# Patient Record
Sex: Male | Born: 2000 | Race: Black or African American | Hispanic: No | Marital: Single | Smoking: Current some day smoker
Health system: Southern US, Community
[De-identification: ages and names within clinical notes are randomized; demographics above are authoritative.]

## PROBLEM LIST (undated history)

## (undated) DIAGNOSIS — J302 Other seasonal allergic rhinitis: Secondary | ICD-10-CM

## (undated) HISTORY — PX: NO PAST SURGERIES: SHX2092

---
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
## 2018-12-02 ENCOUNTER — Encounter (HOSPITAL_COMMUNITY): Payer: Self-pay | Admitting: Emergency Medicine

## 2018-12-02 ENCOUNTER — Emergency Department (HOSPITAL_COMMUNITY)
Admission: EM | Admit: 2018-12-02 | Discharge: 2018-12-02 | Disposition: A | Discharge status: Home / Self Care | Payer: BLUE CROSS/BLUE SHIELD | Attending: Emergency Medicine | Admitting: Emergency Medicine

## 2018-12-02 ENCOUNTER — Other Ambulatory Visit: Payer: Self-pay

## 2018-12-02 DIAGNOSIS — H02846 Edema of left eye, unspecified eyelid: Secondary | ICD-10-CM | POA: Diagnosis present

## 2018-12-02 DIAGNOSIS — Z5321 Procedure and treatment not carried out due to patient leaving prior to being seen by health care provider: Secondary | ICD-10-CM | POA: Insufficient documentation

## 2018-12-02 NOTE — ED Notes (Signed)
Pt left without being seen by pa. Pt was roomed but left before provider could see pt about his illness

## 2018-12-02 NOTE — ED Triage Notes (Signed)
Pt here for left eye swelling for 1 week. Pt states it has not been draining

## 2018-12-04 ENCOUNTER — Other Ambulatory Visit: Payer: Self-pay

## 2018-12-04 ENCOUNTER — Ambulatory Visit (HOSPITAL_COMMUNITY)
Admission: EM | Admit: 2018-12-04 | Discharge: 2018-12-04 | Disposition: A | Discharge status: Home / Self Care | Payer: BLUE CROSS/BLUE SHIELD | Attending: Emergency Medicine | Admitting: Emergency Medicine

## 2018-12-04 ENCOUNTER — Encounter (HOSPITAL_COMMUNITY): Payer: Self-pay | Admitting: *Deleted

## 2018-12-04 DIAGNOSIS — H00024 Hordeolum internum left upper eyelid: Secondary | ICD-10-CM | POA: Diagnosis not present

## 2018-12-04 MED ORDER — SULFAMETHOXAZOLE-TRIMETHOPRIM 800-160 MG PO TABS: 1.0000 | ORAL_TABLET | Freq: Two times a day (BID) | ORAL | 0 refills | Status: AC

## 2018-12-04 NOTE — Discharge Instructions (Signed)
Complete course of antibiotics as prescribed.  Warm compresses 3-4 times a day.  Lid scrubs daily.  Please follow up with ophthalmology for recheck as may need further treatment if persistent.

## 2018-12-04 NOTE — ED Provider Notes (Signed)
[facility]    CSN: [id number] Arrival date & time: 12/04/18  1418     History   Chief Complaint Chief Complaint  Patient presents with  . Eye Problem    HPI Tom Carpenter is a 18 y.o. male.   Novak presents with complaints of left upper eye lid redness and swelling which has been waxing and waning for the past 3-4 weeks. States he has a history of styes which he treats at home, but this has been more persistent than usual. Improves with warm compresses. No eye ball pain or drainage. No vision changes or loss. Doesn't wear glasses or contacts. Hasn't followed with an eye doctor. No injury to the eye. No fevers. Without contributing medical history.      ROS per HPI.      History reviewed. No pertinent past medical history.  There are no active problems to display for this patient.   History reviewed. No pertinent surgical history.     Home Medications    Prior to Admission medications   Medication Sig Start Date End Date Taking? Authorizing Provider  sulfamethoxazole-trimethoprim (BACTRIM DS) 800-160 MG tablet Take 1 tablet by mouth 2 (two) times daily for 5 days. 12/04/18 12/09/18  Georgetta Haber, NP    Family History No family history on file.  Social History Social History   Tobacco Use  . Smoking status: Current Some Day Smoker  . Smokeless tobacco: Never Used  Substance Use Topics  . Alcohol use: Never    Frequency: Never  . Drug use: Never     Allergies   Patient has no allergy information on record.   Review of Systems Review of Systems   Physical Exam Triage Vital Signs ED Triage Vitals  Enc Vitals Group     BP 12/04/18 1501 (!) 109/50     Pulse Rate 12/04/18 1501 65     Resp 12/04/18 1501 12     Temp 12/04/18 1501 98.1 F (36.7 C)     Temp Source 12/04/18 1501 Oral     SpO2 12/04/18 1501 100 %     Weight --      Height 12/04/18 1508 6\' 1"  (1.854 m)     Head Circumference --      Peak Flow --      Pain Score  12/04/18 1508 6     Pain Loc --      Pain Edu? --      Excl. in GC? --    No data found.  Updated Vital Signs BP (!) 109/50 (BP Location: Left Arm)   Pulse 65   Temp 98.1 F (36.7 C) (Oral)   Resp 12   Ht 6\' 1"  (1.854 m)   SpO2 100%   BMI 20.45 kg/m    Physical Exam Constitutional:      Appearance: He is well-developed.  Eyes:     General: Vision grossly intact. Gaze aligned appropriately.        Left eye: Hordeolum present.No discharge.     Extraocular Movements: Extraocular movements intact.     Conjunctiva/sclera: Conjunctivae normal.  Cardiovascular:     Rate and Rhythm: Normal rate and regular rhythm.  Pulmonary:     Effort: Pulmonary effort is normal.     Breath sounds: Normal breath sounds.  Skin:    General: Skin is warm and dry.  Neurological:     Mental Status: He is alert and oriented to person, place, and time.  UC Treatments / Results  Labs (all labs ordered are listed, but only abnormal results are displayed) Labs Reviewed - No data to display  EKG None  Radiology No results found.  Procedures Procedures (including critical care time)  Medications Ordered in UC Medications - No data to display  Initial Impression / Assessment and Plan / UC Course  I have reviewed the triage vital signs and the nursing notes.  Pertinent labs & imaging results that were available during my care of the patient were reviewed by me and considered in my medical decision making (see chart for details).     Persistent left upper lid hordeolum. Will provide oral antibiotics as well as warm compresses and lid scrubs. Follow up with ophthalmology as may need further intervention. Patient verbalized understanding and agreeable to plan.   Final Clinical Impressions(s) / UC Diagnoses   Final diagnoses:  Hordeolum internum of left upper eyelid     Discharge Instructions     Complete course of antibiotics as prescribed.  Warm compresses 3-4 times a day.    Lid scrubs daily.  Please follow up with ophthalmology for recheck as may need further treatment if persistent.    ED Prescriptions    Medication Sig Dispense Auth. Provider   sulfamethoxazole-trimethoprim (BACTRIM DS) 800-160 MG tablet Take 1 tablet by mouth 2 (two) times daily for 5 days. 10 tablet Georgetta Haber, NP     Controlled Substance Prescriptions [place] Controlled Substance Registry consulted? Not Applicable   Georgetta Haber, NP 12/04/18 1626

## 2018-12-04 NOTE — ED Triage Notes (Signed)
C/o swelling to left eyelid onset 3 weeks ago, states the swelling comes and goes, he has been using warm compresses to  His eye

## 2020-08-31 ENCOUNTER — Encounter (HOSPITAL_COMMUNITY): Payer: Self-pay | Admitting: Obstetrics and Gynecology

## 2020-08-31 ENCOUNTER — Other Ambulatory Visit: Payer: Self-pay

## 2020-08-31 ENCOUNTER — Emergency Department (HOSPITAL_COMMUNITY): Payer: BC Managed Care – PPO

## 2020-08-31 ENCOUNTER — Emergency Department (HOSPITAL_COMMUNITY)
Admission: EM | Admit: 2020-08-31 | Discharge: 2020-08-31 | Disposition: A | Discharge status: Home / Self Care | Payer: BC Managed Care – PPO | Attending: Emergency Medicine | Admitting: Emergency Medicine

## 2020-08-31 DIAGNOSIS — R197 Diarrhea, unspecified: Secondary | ICD-10-CM | POA: Diagnosis not present

## 2020-08-31 DIAGNOSIS — R0602 Shortness of breath: Secondary | ICD-10-CM | POA: Diagnosis not present

## 2020-08-31 DIAGNOSIS — R112 Nausea with vomiting, unspecified: Secondary | ICD-10-CM | POA: Diagnosis present

## 2020-08-31 DIAGNOSIS — R079 Chest pain, unspecified: Secondary | ICD-10-CM | POA: Diagnosis not present

## 2020-08-31 DIAGNOSIS — F17290 Nicotine dependence, other tobacco product, uncomplicated: Secondary | ICD-10-CM | POA: Diagnosis not present

## 2020-08-31 DIAGNOSIS — R6883 Chills (without fever): Secondary | ICD-10-CM | POA: Diagnosis not present

## 2020-08-31 DIAGNOSIS — Z20822 Contact with and (suspected) exposure to covid-19: Secondary | ICD-10-CM | POA: Diagnosis not present

## 2020-08-31 LAB — CBC WITH DIFFERENTIAL/PLATELET
Abs Immature Granulocytes: 0.03 10*3/uL (ref 0.00–0.07)
Basophils Absolute: 0 10*3/uL (ref 0.0–0.1)
Basophils Relative: 0 %
Eosinophils Absolute: 0.1 10*3/uL (ref 0.0–0.5)
Eosinophils Relative: 1 %
HCT: 47.5 % (ref 39.0–52.0)
Hemoglobin: 16.8 g/dL (ref 13.0–17.0)
Immature Granulocytes: 0 %
Lymphocytes Relative: 17 %
Lymphs Abs: 1.6 10*3/uL (ref 0.7–4.0)
MCH: 31.3 pg (ref 26.0–34.0)
MCHC: 35.4 g/dL (ref 30.0–36.0)
MCV: 88.6 fL (ref 80.0–100.0)
Monocytes Absolute: 0.9 10*3/uL (ref 0.1–1.0)
Monocytes Relative: 9 %
Neutro Abs: 7 10*3/uL (ref 1.7–7.7)
Neutrophils Relative %: 73 %
Platelets: 294 10*3/uL (ref 150–400)
RBC: 5.36 MIL/uL (ref 4.22–5.81)
RDW: 11.2 % — ABNORMAL LOW (ref 11.5–15.5)
WBC: 9.7 10*3/uL (ref 4.0–10.5)
nRBC: 0 % (ref 0.0–0.2)

## 2020-08-31 LAB — COMPREHENSIVE METABOLIC PANEL
ALT: 15 U/L (ref 0–44)
AST: 19 U/L (ref 15–41)
Albumin: 5.2 g/dL — ABNORMAL HIGH (ref 3.5–5.0)
Alkaline Phosphatase: 65 U/L (ref 38–126)
Anion gap: 13 (ref 5–15)
BUN: 26 mg/dL — ABNORMAL HIGH (ref 6–20)
CO2: 29 mmol/L (ref 22–32)
Calcium: 9.8 mg/dL (ref 8.9–10.3)
Chloride: 89 mmol/L — ABNORMAL LOW (ref 98–111)
Creatinine, Ser: 1.08 mg/dL (ref 0.61–1.24)
GFR, Estimated: >60 mL/min (ref >60)
Glucose, Bld: 105 mg/dL — ABNORMAL HIGH (ref 70–99)
Potassium: 3.4 mmol/L — ABNORMAL LOW (ref 3.5–5.1)
Sodium: 131 mmol/L — ABNORMAL LOW (ref 135–145)
Total Bilirubin: 1 mg/dL (ref 0.3–1.2)
Total Protein: 8.7 g/dL — ABNORMAL HIGH (ref 6.5–8.1)

## 2020-08-31 LAB — RESP PANEL BY RT-PCR (FLU A&B, COVID) ARPGX2
Influenza A by PCR: NEGATIVE
Influenza B by PCR: NEGATIVE
SARS Coronavirus 2 by RT PCR: NEGATIVE

## 2020-08-31 LAB — LIPASE, BLOOD: Lipase: 24 U/L (ref 11–51)

## 2020-08-31 MED ORDER — ONDANSETRON HCL 8 MG PO TABS: 8.0000 mg | ORAL_TABLET | Freq: Three times a day (TID) | ORAL | 0 refills | Status: DC | PRN

## 2020-08-31 MED ORDER — SODIUM CHLORIDE 0.9 % IV BOLUS
1000.0000 mL | Freq: Once | INTRAVENOUS | Status: AC
  Administered 2020-08-31: 1000 mL via INTRAVENOUS

## 2020-08-31 NOTE — ED Triage Notes (Signed)
Patient reports he "had a stomach virus" on Sunday and has since become SOB and developed chest pain and feels like he cannot keep anything down

## 2020-08-31 NOTE — ED Provider Notes (Signed)
[facility]EM[facility]ENCY [facility]PT Provider Note   CSN: [id number] Arrival date & time: 08/31/20  3790     History Chief Complaint  Patient presents with  . Shortness of Breath  . Chest Pain    Tom Carpenter is a 19 y.o. male.  HPI He presents for evaluation of an illness for 4 days after eating some steak which he thought was "bad."  Initially had diarrhea, but none since.  He now has persistent vomiting, which is yellow in color.  He has not seen any blood in his output.  He has had chills but not documented a fever.  He denies cough, shortness of breath, focal weakness or paresthesia.  No known sick contacts.  There are no other known modifying factors.    No past medical history on file.  There are no problems to display for this patient.   No past surgical history on file.     No family history on file.  Social History   Tobacco Use  . Smoking status: Current Some Day Smoker    Types: Cigars  . Smokeless tobacco: Never Used  Vaping Use  . Vaping Use: Never used  Substance Use Topics  . Alcohol use: Never  . Drug use: Yes    Types: Marijuana    Home Medications Prior to Admission medications   Medication Sig Start Date End Date Taking? Authorizing Provider  ondansetron (ZOFRAN) 8 MG tablet Take 1 tablet (8 mg total) by mouth every 8 (eight) hours as needed for nausea or vomiting. 08/31/20   Mancel Bale, MD    Allergies    Patient has no known allergies.  Review of Systems   Review of Systems  All other systems reviewed and are negative.   Physical Exam Updated Vital Signs BP 121/73 (BP Location: Left Arm)   Pulse (!) 55   Temp 99.7 F (37.6 C) (Oral)   Resp 14   Ht 6\' 1"  (1.854 m)   Wt 57.6 kg   SpO2 99%   BMI 16.76 kg/m   Physical Exam Vitals and nursing note reviewed.  Constitutional:      General: He is not in acute distress.    Appearance: He is well-developed. He is not ill-appearing, toxic-appearing or  diaphoretic.  HENT:     Head: Normocephalic and atraumatic.     Right Ear: External ear normal.     Left Ear: External ear normal.  Eyes:     Conjunctiva/sclera: Conjunctivae normal.     Pupils: Pupils are equal, round, and reactive to light.  Neck:     Trachea: Phonation normal.  Cardiovascular:     Rate and Rhythm: Normal rate.  Pulmonary:     Effort: Pulmonary effort is normal. No respiratory distress.     Breath sounds: No stridor.  Abdominal:     General: There is no distension.     Palpations: Abdomen is soft.     Tenderness: There is no abdominal tenderness.  Musculoskeletal:        General: Normal range of motion.     Cervical back: Normal range of motion and neck supple.  Skin:    General: Skin is warm and dry.  Neurological:     Mental Status: He is alert and oriented to person, place, and time.     Cranial Nerves: No cranial nerve deficit.     Sensory: No sensory deficit.     Motor: No abnormal muscle tone.     Coordination: Coordination  normal.  Psychiatric:        Mood and Affect: Mood normal.        Behavior: Behavior normal.        Thought Content: Thought content normal.        Judgment: Judgment normal.     ED Results / Procedures / Treatments   Labs (all labs ordered are listed, but only abnormal results are displayed) Labs Reviewed  COMPREHENSIVE METABOLIC PANEL - Abnormal; Notable for the following components:      Result Value   Sodium 131 (*)    Potassium 3.4 (*)    Chloride 89 (*)    Glucose, Bld 105 (*)    BUN 26 (*)    Total Protein 8.7 (*)    Albumin 5.2 (*)    All other components within normal limits  CBC WITH DIFFERENTIAL/PLATELET - Abnormal; Notable for the following components:   RDW 11.2 (*)    All other components within normal limits  RESP PANEL BY RT-PCR (FLU A&B, COVID) ARPGX2  LIPASE, BLOOD    EKG EKG Interpretation  Date/Time:  Thursday August 31 2020 08:48:05 EST Ventricular Rate:  51 PR Interval:    QRS  Duration: 100 QT Interval:  444 QTC Calculation: 409 R Axis:   87 Text Interpretation: Sinus rhythm RSR' in V1 or V2, probably normal variant Probable left ventricular hypertrophy Abnrm T, consider ischemia, anterolateral lds ST elev, probable normal early repol pattern No old tracing to compare Confirmed by Mancel Bale [phone]) on 08/31/2020 8:51:52 AM   Radiology DG Chest 2 View  Result Date: 08/31/2020 CLINICAL DATA:  Shortness of breath, nausea and vomiting EXAM: CHEST - 2 VIEW COMPARISON:  None. FINDINGS: The cardiomediastinal silhouette is normal in contour. No pleural effusion. No pneumothorax. No acute pleuroparenchymal abnormality. Visualized abdomen is unremarkable. No acute osseous abnormality noted. IMPRESSION: No acute cardiopulmonary abnormality. Electronically Signed   By: Meda Klinefelter MD   On: 08/31/2020 09:15    Procedures Procedures (including critical care time)  Medications Ordered in ED Medications  sodium chloride 0.9 % bolus 1,000 mL (0 mLs Intravenous Stopped 08/31/20 1057)    ED Course  I have reviewed the triage vital signs and the nursing notes.  Pertinent labs & imaging results that were available during my care of the patient were reviewed by me and considered in my medical decision making (see chart for details).    MDM Rules/Calculators/A&P                           Patient Vitals for the past 24 hrs:  BP Temp Temp src Pulse Resp SpO2 Height Weight  08/31/20 1205 121/73 -- -- (!) 55 14 99 % -- --  08/31/20 1000 120/80 -- -- (!) 53 20 100 % -- --  08/31/20 0851 127/84 99.7 F (37.6 C) Oral (!) 52 17 100 % 6\' 1"  (1.854 m) 57.6 kg    12:27 PM Reevaluation with update and discussion. After initial assessment and treatment, an updated evaluation reveals eating and drinking normally at this time.  Findings discussed with the patient and all questions were answered.   Medical Decision Making:  This patient is presenting for  evaluation of nausea, vomiting and diarrhea, which does require a range of treatment options, and is a complaint that involves a moderate risk of morbidity and mortality. The differential diagnoses include gastritis, food poisoning, Covid infection. I decided to review old records, and  in summary healthy young male with 4-day illness primary gastrointestinal symptoms.  I did not require additional historical information from anyone.  Clinical Laboratory Tests Ordered, included CBC, Metabolic panel and Covid test, lipase. Review indicates negative viral swab, normal CBC, normal lipase, hyponatremia, hypokalemia, hypochloremia, hyperglycemia, azotemia. Radiologic Tests Ordered, included chest x-ray.  I independently Visualized: Radiographic images, which show no infiltrate or edema     Critical Interventions-clinical evaluation, laboratory testing, IV fluids, observation and reassessment  After These Interventions, the Patient was reevaluated and was found improved, tolerating oral intake.  Doubt serious bacterial infection, metabolic instability or impending vascular collapse.  Suspect nonspecific enteritis.  HOBART MARTE was evaluated in Emergency Department on 08/31/2020 for the symptoms described in the history of present illness. He was evaluated in the context of the global COVID-19 pandemic, which necessitated consideration that the patient might be at risk for infection with the SARS-CoV-2 virus that causes COVID-19. Institutional protocols and algorithms that pertain to the evaluation of patients at risk for COVID-19 are in a state of rapid change based on information released by regulatory bodies including the CDC and federal and state organizations. These policies and algorithms were followed during the patient's care in the ED.   CRITICAL CARE-no Performed by: Mancel Bale  Nursing Notes Reviewed/ Care Coordinated Applicable Imaging Reviewed Interpretation of Laboratory Data  incorporated into ED treatment  The patient appears reasonably screened and/or stabilized for discharge and I doubt any other medical condition or other [facility] requiring further screening, evaluation, or treatment in the ED at this time prior to discharge.  Plan: Home Medications-OTC as needed; Home Treatments-gradual advance diet; return here if the recommended treatment, does not improve the symptoms; Recommended follow up-PCP, as needed     Final Clinical Impression(s) / ED Diagnoses Final diagnoses:  Nausea vomiting and diarrhea    Rx / DC Orders ED Discharge Orders         Ordered    ondansetron (ZOFRAN) 8 MG tablet  Every 8 hours PRN        08/31/20 1227           Mancel Bale, MD 08/31/20 1231

## 2020-08-31 NOTE — Discharge Instructions (Signed)
Start with clear liquids and crackers today then gradually advance her diet.  You will likely continue to be sick another few days.  Follow-up with the doctor of your choice, as needed for problems.

## 2020-08-31 NOTE — ED Notes (Signed)
Patient stated he would like to try to eat something. After checking with the nurse patient was given two packs of crackers and more ice water.

## 2021-12-27 IMAGING — CR DG CHEST 2V
2 series · 2 of 2 positions shown · non-contrast
Comparison: None.

CLINICAL DATA: Shortness of breath, nausea and vomiting

EXAM:
CHEST - 2 VIEW

[w chest pa]
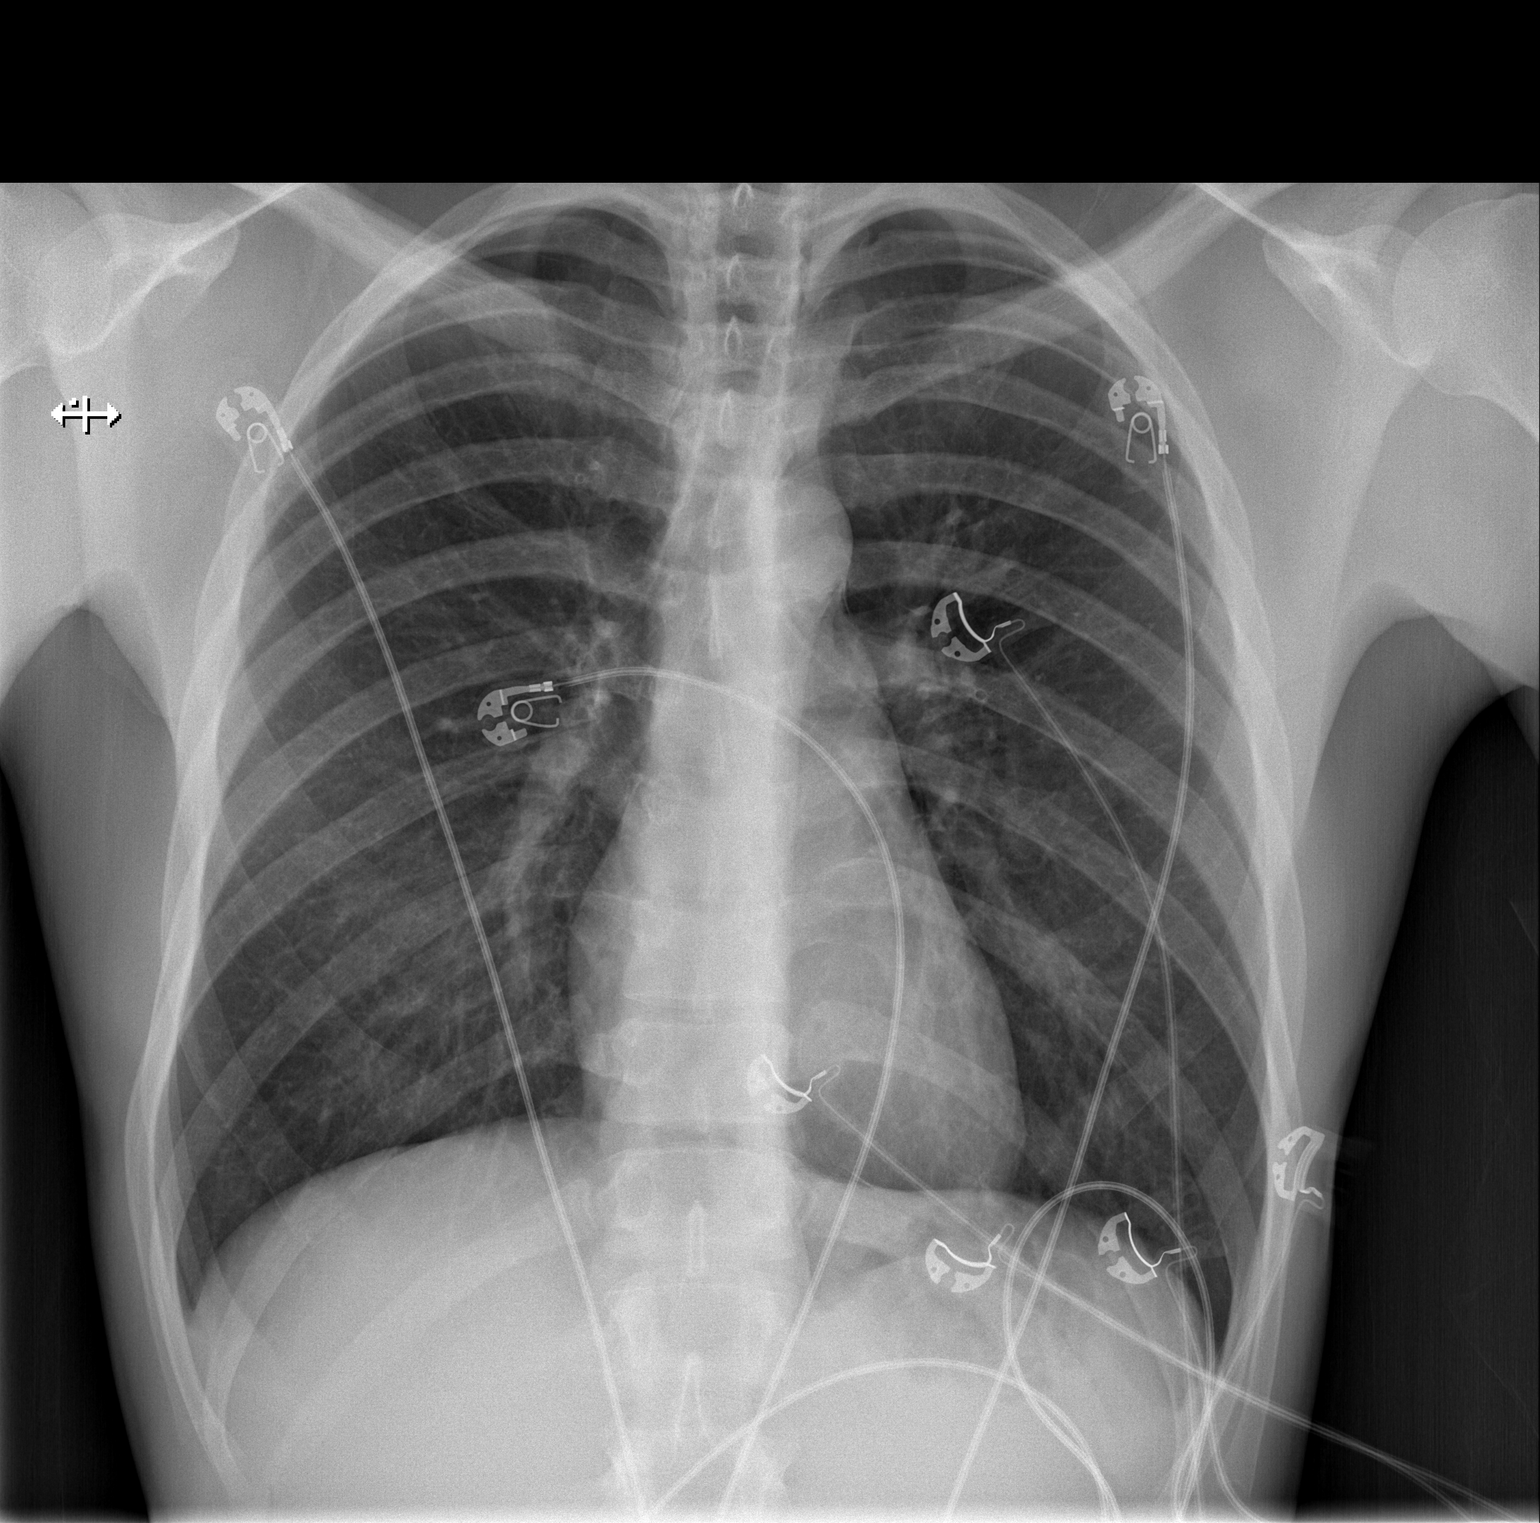

[w chest lat]
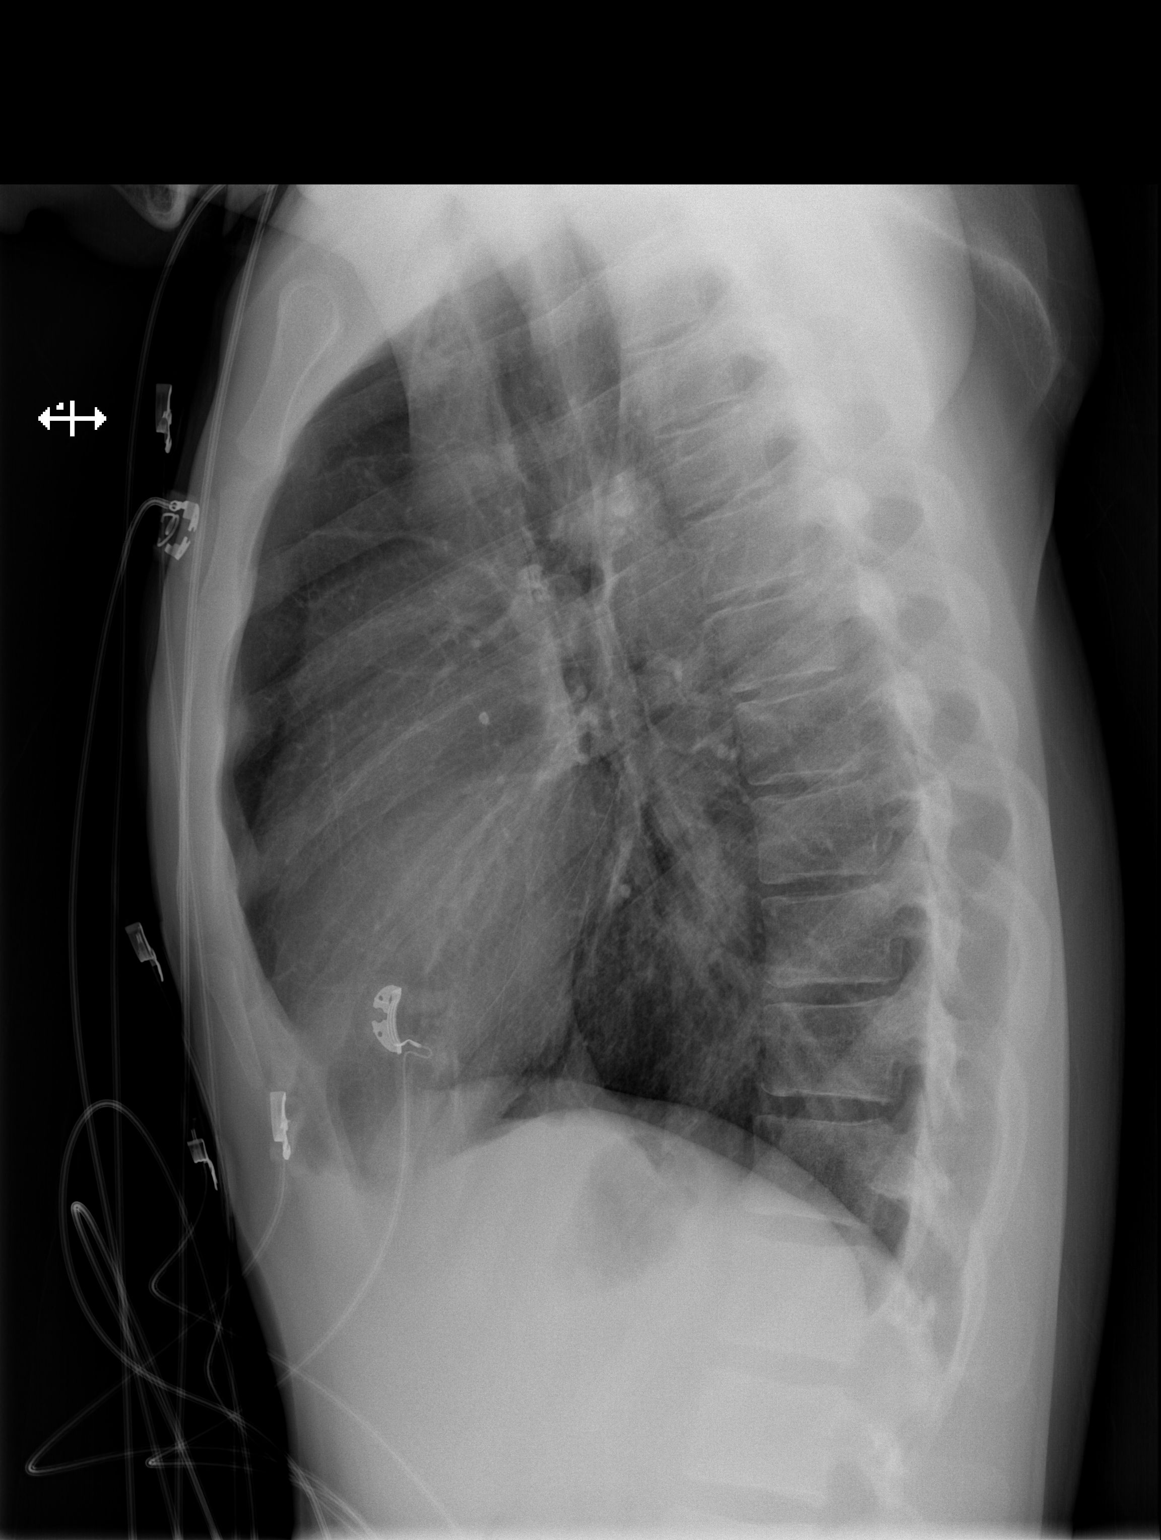

[2 of 2 positions shown; findings below may reference images not displayed]

FINDINGS: The cardiomediastinal silhouette is normal in contour. No pleural
effusion. No pneumothorax. No acute pleuroparenchymal abnormality.
Visualized abdomen is unremarkable. No acute osseous abnormality
noted.
IMPRESSION: No acute cardiopulmonary abnormality.

## 2022-03-26 ENCOUNTER — Emergency Department (HOSPITAL_COMMUNITY): Payer: BC Managed Care – PPO

## 2022-03-26 ENCOUNTER — Other Ambulatory Visit: Payer: Self-pay

## 2022-03-26 ENCOUNTER — Emergency Department (HOSPITAL_COMMUNITY)
Admission: EM | Admit: 2022-03-26 | Discharge: 2022-03-26 | Disposition: A | Discharge status: Home / Self Care | Payer: BC Managed Care – PPO | Attending: Emergency Medicine | Admitting: Emergency Medicine

## 2022-03-26 DIAGNOSIS — W3400XA Accidental discharge from unspecified firearms or gun, initial encounter: Secondary | ICD-10-CM | POA: Diagnosis not present

## 2022-03-26 DIAGNOSIS — E876 Hypokalemia: Secondary | ICD-10-CM | POA: Diagnosis not present

## 2022-03-26 DIAGNOSIS — S71132A Puncture wound without foreign body, left thigh, initial encounter: Secondary | ICD-10-CM

## 2022-03-26 DIAGNOSIS — Y903 Blood alcohol level of 60-79 mg/100 ml: Secondary | ICD-10-CM | POA: Diagnosis not present

## 2022-03-26 DIAGNOSIS — S71142A Puncture wound with foreign body, left thigh, initial encounter: Secondary | ICD-10-CM | POA: Insufficient documentation

## 2022-03-26 DIAGNOSIS — S92101B Unspecified fracture of right talus, initial encounter for open fracture: Secondary | ICD-10-CM

## 2022-03-26 DIAGNOSIS — Z20822 Contact with and (suspected) exposure to covid-19: Secondary | ICD-10-CM | POA: Diagnosis not present

## 2022-03-26 DIAGNOSIS — S92191B Other fracture of right talus, initial encounter for open fracture: Secondary | ICD-10-CM | POA: Insufficient documentation

## 2022-03-26 DIAGNOSIS — E872 Acidosis, unspecified: Secondary | ICD-10-CM | POA: Insufficient documentation

## 2022-03-26 DIAGNOSIS — R780 Finding of alcohol in blood: Secondary | ICD-10-CM | POA: Insufficient documentation

## 2022-03-26 DIAGNOSIS — S99911A Unspecified injury of right ankle, initial encounter: Secondary | ICD-10-CM | POA: Diagnosis present

## 2022-03-26 LAB — URINALYSIS, ROUTINE W REFLEX MICROSCOPIC
Bilirubin Urine: NEGATIVE
Glucose, UA: NEGATIVE mg/dL
Hgb urine dipstick: NEGATIVE
Ketones, ur: NEGATIVE mg/dL
Leukocytes,Ua: NEGATIVE
Nitrite: NEGATIVE
Protein, ur: NEGATIVE mg/dL
Specific Gravity, Urine: 1.024 (ref 1.005–1.030)
pH: 6 (ref 5.0–8.0)

## 2022-03-26 LAB — COMPREHENSIVE METABOLIC PANEL
ALT: 12 U/L (ref 0–44)
AST: 20 U/L (ref 15–41)
Albumin: 4.4 g/dL (ref 3.5–5.0)
Alkaline Phosphatase: 105 U/L (ref 38–126)
Anion gap: 13 (ref 5–15)
BUN: 10 mg/dL (ref 6–20)
CO2: 21 mmol/L — ABNORMAL LOW (ref 22–32)
Calcium: 9.5 mg/dL (ref 8.9–10.3)
Chloride: 103 mmol/L (ref 98–111)
Creatinine, Ser: 1.04 mg/dL (ref 0.61–1.24)
GFR, Estimated: >60 mL/min (ref >60)
Glucose, Bld: 112 mg/dL — ABNORMAL HIGH (ref 70–99)
Potassium: 3 mmol/L — ABNORMAL LOW (ref 3.5–5.1)
Sodium: 137 mmol/L (ref 135–145)
Total Bilirubin: 0.3 mg/dL (ref 0.3–1.2)
Total Protein: 7.3 g/dL (ref 6.5–8.1)

## 2022-03-26 LAB — LACTIC ACID, PLASMA: Lactic Acid, Venous: 3 mmol/L (ref 0.5–1.9)

## 2022-03-26 LAB — CBC
HCT: 42.6 % (ref 39.0–52.0)
Hemoglobin: 14.3 g/dL (ref 13.0–17.0)
MCH: 31 pg (ref 26.0–34.0)
MCHC: 33.6 g/dL (ref 30.0–36.0)
MCV: 92.4 fL (ref 80.0–100.0)
Platelets: 314 10*3/uL (ref 150–400)
RBC: 4.61 MIL/uL (ref 4.22–5.81)
RDW: 12.9 % (ref 11.5–15.5)
WBC: 12 10*3/uL — ABNORMAL HIGH (ref 4.0–10.5)
nRBC: 0 % (ref 0.0–0.2)

## 2022-03-26 LAB — I-STAT CHEM 8, ED
BUN: 10 mg/dL (ref 6–20)
Calcium, Ion: 1.07 mmol/L — ABNORMAL LOW (ref 1.15–1.40)
Chloride: 102 mmol/L (ref 98–111)
Creatinine, Ser: 1.1 mg/dL (ref 0.61–1.24)
Glucose, Bld: 111 mg/dL — ABNORMAL HIGH (ref 70–99)
HCT: 45 % (ref 39.0–52.0)
Hemoglobin: 15.3 g/dL (ref 13.0–17.0)
Potassium: 2.9 mmol/L — ABNORMAL LOW (ref 3.5–5.1)
Sodium: 138 mmol/L (ref 135–145)
TCO2: 24 mmol/L (ref 22–32)

## 2022-03-26 LAB — RESP PANEL BY RT-PCR (FLU A&B, COVID) ARPGX2
Influenza A by PCR: NEGATIVE
Influenza B by PCR: NEGATIVE
SARS Coronavirus 2 by RT PCR: NEGATIVE

## 2022-03-26 LAB — PROTIME-INR
INR: 1.1 (ref 0.8–1.2)
Prothrombin Time: 13.6 seconds (ref 11.4–15.2)

## 2022-03-26 LAB — SAMPLE TO BLOOD BANK

## 2022-03-26 LAB — ETHANOL: Alcohol, Ethyl (B): 65 mg/dL — ABNORMAL HIGH (ref <10)

## 2022-03-26 MED ORDER — TETANUS-DIPHTH-ACELL PERTUSSIS 5-2.5-18.5 LF-MCG/0.5 IM SUSY: 0.5000 mL | PREFILLED_SYRINGE | Freq: Once | INTRAMUSCULAR | Status: DC

## 2022-03-26 MED ORDER — SENNOSIDES-DOCUSATE SODIUM 8.6-50 MG PO TABS: 1.0000 | ORAL_TABLET | Freq: Every evening | ORAL | 0 refills | Status: DC | PRN

## 2022-03-26 MED ORDER — FENTANYL CITRATE PF 50 MCG/ML IJ SOSY
100.0000 ug | PREFILLED_SYRINGE | Freq: Once | INTRAMUSCULAR | Status: AC
  Administered 2022-03-26: 100 ug via INTRAVENOUS
  Filled 2022-03-26: qty 2

## 2022-03-26 MED ORDER — IBUPROFEN 600 MG PO TABS: 600.0000 mg | ORAL_TABLET | Freq: Four times a day (QID) | ORAL | 0 refills | Status: DC | PRN

## 2022-03-26 MED ORDER — IOHEXOL 350 MG/ML SOLN
100.0000 mL | Freq: Once | INTRAVENOUS | Status: AC | PRN
  Administered 2022-03-26: 100 mL via INTRAVENOUS

## 2022-03-26 MED ORDER — SODIUM CHLORIDE 0.9 % IV BOLUS
1000.0000 mL | Freq: Once | INTRAVENOUS | Status: AC
  Administered 2022-03-26: 1000 mL via INTRAVENOUS

## 2022-03-26 MED ORDER — CEFAZOLIN SODIUM-DEXTROSE 2-4 GM/100ML-% IV SOLN
2.0000 g | Freq: Once | INTRAVENOUS | Status: AC
  Administered 2022-03-26: 2 g via INTRAVENOUS
  Filled 2022-03-26: qty 100

## 2022-03-26 MED ORDER — CEPHALEXIN 500 MG PO CAPS
500.0000 mg | ORAL_CAPSULE | Freq: Two times a day (BID) | ORAL | 0 refills | Status: AC
Start: 2022-03-26 — End: 2022-04-02

## 2022-03-26 MED ORDER — OXYCODONE-ACETAMINOPHEN 5-325 MG PO TABS: 1.0000 | ORAL_TABLET | Freq: Four times a day (QID) | ORAL | 0 refills | Status: DC | PRN

## 2022-03-26 MED ORDER — POTASSIUM CHLORIDE CRYS ER 20 MEQ PO TBCR: 40.0000 meq | EXTENDED_RELEASE_TABLET | Freq: Every day | ORAL | 0 refills | Status: DC
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
## 2022-03-26 NOTE — ED Notes (Signed)
Ortho called 

## 2022-03-26 NOTE — ED Notes (Signed)
Patient verbalizes understanding of discharge instructions. Opportunity for questioning and answers were provided. Armband removed by staff, pt discharged from ED via wheelchair.  

## 2022-03-26 NOTE — ED Notes (Signed)
Pt taken to CT.
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
## 2022-03-26 NOTE — Progress Notes (Signed)
Orthopedic Tech Progress Note Patient Details:  JAIDIN RICHISON [birth year] [id number]  Ortho Devices Type of Ortho Device: Cotton web roll, Crutches, Stirrup splint, Short leg splint Ortho Device/Splint Location: RLE Ortho Device/Splint Interventions: Ordered, Application, Adjustment   Post Interventions Patient Tolerated: Well Instructions Provided: Care of device  Donald Pore 03/26/2022, 5:56 AM

## 2022-03-26 NOTE — ED Notes (Signed)
MD notified of critical lactic  
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
## 2022-03-26 NOTE — ED Notes (Signed)
Pt taken to CT with this RN.

## 2022-03-26 NOTE — ED Notes (Signed)
Ortho at bedside.
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
## 2022-04-01 ENCOUNTER — Other Ambulatory Visit: Payer: Self-pay | Admitting: Orthopaedic Surgery

## 2022-04-01 ENCOUNTER — Encounter (HOSPITAL_COMMUNITY): Payer: Self-pay | Admitting: Orthopaedic Surgery

## 2022-04-01 NOTE — Progress Notes (Signed)
PCP - none Cardiologist - n/a  Chest x-ray - 03/26/22 (1V) EKG - n/a Stress Test - n/a ECHO - n/a Cardiac Cath - n/a  ICD Pacemaker/Loop - n/a  Sleep Study -  n/a CPAP - none  STOP now taking any Aspirin (unless otherwise instructed by your surgeon), Aleve, Naproxen, Ibuprofen, Motrin, Advil, Goody's, BC's, all herbal medications, fish oil, and all vitamins.   Coronavirus Screening Do you have any of the following symptoms:  Cough yes/no: No Fever (>100.30F)  yes/no: No Runny nose yes/no: No Sore throat yes/no: No Difficulty breathing/shortness of breath  yes/no: No  Have you traveled in the last 14 days and where? yes/no: No  Patient verbalized understanding of instructions that were given via phone.

## 2022-04-02 ENCOUNTER — Other Ambulatory Visit: Payer: Self-pay

## 2022-04-02 ENCOUNTER — Ambulatory Visit (HOSPITAL_COMMUNITY): Payer: BC Managed Care – PPO | Admitting: Anesthesiology

## 2022-04-02 ENCOUNTER — Encounter (HOSPITAL_COMMUNITY): Payer: Self-pay | Admitting: Orthopaedic Surgery

## 2022-04-02 ENCOUNTER — Ambulatory Visit (HOSPITAL_COMMUNITY)
Admission: RE | Admit: 2022-04-02 | Discharge: 2022-04-02 | Disposition: A | Discharge status: Home / Self Care | Payer: BC Managed Care – PPO | Source: Ambulatory Visit | Attending: Orthopaedic Surgery | Admitting: Orthopaedic Surgery

## 2022-04-02 ENCOUNTER — Encounter (HOSPITAL_COMMUNITY)
Admission: RE | Disposition: A | Discharge status: Home / Self Care | Payer: Self-pay | Source: Ambulatory Visit | Attending: Orthopaedic Surgery

## 2022-04-02 ENCOUNTER — Ambulatory Visit (HOSPITAL_COMMUNITY): Payer: BC Managed Care – PPO

## 2022-04-02 DIAGNOSIS — F17200 Nicotine dependence, unspecified, uncomplicated: Secondary | ICD-10-CM | POA: Diagnosis not present

## 2022-04-02 DIAGNOSIS — W3400XA Accidental discharge from unspecified firearms or gun, initial encounter: Secondary | ICD-10-CM | POA: Diagnosis not present

## 2022-04-02 DIAGNOSIS — S71142A Puncture wound with foreign body, left thigh, initial encounter: Secondary | ICD-10-CM | POA: Insufficient documentation

## 2022-04-02 DIAGNOSIS — S92121B Displaced fracture of body of right talus, initial encounter for open fracture: Secondary | ICD-10-CM | POA: Diagnosis present

## 2022-04-02 DIAGNOSIS — F1290 Cannabis use, unspecified, uncomplicated: Secondary | ICD-10-CM | POA: Insufficient documentation

## 2022-04-02 DIAGNOSIS — F17290 Nicotine dependence, other tobacco product, uncomplicated: Secondary | ICD-10-CM | POA: Diagnosis not present

## 2022-04-02 HISTORY — DX: Other seasonal allergic rhinitis: J30.2

## 2022-04-02 HISTORY — PX: I & D EXTREMITY: SHX5045

## 2022-04-02 HISTORY — PX: FOREIGN BODY REMOVAL: SHX962

## 2022-04-02 HISTORY — PX: OPEN REDUCTION INTERNAL FIXATION (ORIF) FOOT LISFRANC FRACTURE: SHX5990

## 2022-04-02 LAB — POCT I-STAT, CHEM 8
BUN: 14 mg/dL (ref 6–20)
Calcium, Ion: 1.16 mmol/L (ref 1.15–1.40)
Chloride: 102 mmol/L (ref 98–111)
Creatinine, Ser: 0.8 mg/dL (ref 0.61–1.24)
Glucose, Bld: 104 mg/dL — ABNORMAL HIGH (ref 70–99)
HCT: 42 % (ref 39.0–52.0)
Hemoglobin: 14.3 g/dL (ref 13.0–17.0)
Potassium: 3.8 mmol/L (ref 3.5–5.1)
Sodium: 137 mmol/L (ref 135–145)
TCO2: 24 mmol/L (ref 22–32)

## 2022-04-02 SURGERY — OPEN REDUCTION INTERNAL FIXATION (ORIF) FOOT LISFRANC FRACTURE
Anesthesia: General | Site: Foot | Laterality: Right

## 2022-04-02 MED ORDER — CHLORHEXIDINE GLUCONATE 0.12 % MT SOLN
15.0000 mL | Freq: Once | OROMUCOSAL | Status: AC
  Administered 2022-04-02: 15 mL via OROMUCOSAL
  Filled 2022-04-02: qty 15

## 2022-04-02 MED ORDER — LACTATED RINGERS IV SOLN: INTRAVENOUS | Status: DC

## 2022-04-02 MED ORDER — EPHEDRINE SULFATE-NACL 50-0.9 MG/10ML-% IV SOSY
PREFILLED_SYRINGE | INTRAVENOUS | Status: DC | PRN
  Administered 2022-04-02 (×2): 5 mg via INTRAVENOUS

## 2022-04-02 MED ORDER — PROPOFOL 10 MG/ML IV BOLUS
INTRAVENOUS | Status: AC
  Filled 2022-04-02: qty 20

## 2022-04-02 MED ORDER — 0.9 % SODIUM CHLORIDE (POUR BTL) OPTIME
TOPICAL | Status: DC | PRN
  Administered 2022-04-02: 1000 mL

## 2022-04-02 MED ORDER — CELECOXIB 200 MG PO CAPS
200.0000 mg | ORAL_CAPSULE | Freq: Once | ORAL | Status: AC
  Administered 2022-04-02: 200 mg via ORAL
  Filled 2022-04-02: qty 1

## 2022-04-02 MED ORDER — PROPOFOL 10 MG/ML IV BOLUS
INTRAVENOUS | Status: DC | PRN
  Administered 2022-04-02: 50 mg via INTRAVENOUS
  Administered 2022-04-02: 200 mg via INTRAVENOUS

## 2022-04-02 MED ORDER — ACETAMINOPHEN 160 MG/5ML PO SOLN: 325.0000 mg | ORAL | Status: DC | PRN

## 2022-04-02 MED ORDER — DEXAMETHASONE SODIUM PHOSPHATE 4 MG/ML IJ SOLN
INTRAMUSCULAR | Status: DC | PRN
  Administered 2022-04-02: 10 mg via INTRAVENOUS

## 2022-04-02 MED ORDER — LIDOCAINE 2% (20 MG/ML) 5 ML SYRINGE
INTRAMUSCULAR | Status: AC
  Filled 2022-04-02: qty 5

## 2022-04-02 MED ORDER — LIDOCAINE HCL (CARDIAC) PF 100 MG/5ML IV SOSY
PREFILLED_SYRINGE | INTRAVENOUS | Status: DC | PRN
  Administered 2022-04-02: 60 mg via INTRAVENOUS

## 2022-04-02 MED ORDER — DEXMEDETOMIDINE HCL IN NACL 200 MCG/50ML IV SOLN
INTRAVENOUS | Status: DC | PRN
  Administered 2022-04-02: 12 ug via INTRAVENOUS

## 2022-04-02 MED ORDER — MIDAZOLAM HCL 2 MG/2ML IJ SOLN
INTRAMUSCULAR | Status: DC
Start: 2022-04-02 — End: 2022-04-02
  Filled 2022-04-02: qty 2

## 2022-04-02 MED ORDER — ONDANSETRON HCL 4 MG/2ML IJ SOLN
INTRAMUSCULAR | Status: DC | PRN
  Administered 2022-04-02: 4 mg via INTRAVENOUS

## 2022-04-02 MED ORDER — DEXMEDETOMIDINE HCL IN NACL 80 MCG/20ML IV SOLN
INTRAVENOUS | Status: AC
  Filled 2022-04-02: qty 20

## 2022-04-02 MED ORDER — MIDAZOLAM HCL 5 MG/5ML IJ SOLN
INTRAMUSCULAR | Status: DC | PRN
  Administered 2022-04-02: 2 mg via INTRAVENOUS

## 2022-04-02 MED ORDER — FENTANYL CITRATE (PF) 100 MCG/2ML IJ SOLN: 25.0000 ug | INTRAMUSCULAR | Status: DC | PRN

## 2022-04-02 MED ORDER — CEFAZOLIN SODIUM-DEXTROSE 2-4 GM/100ML-% IV SOLN
2.0000 g | INTRAVENOUS | Status: AC
  Administered 2022-04-02: 2 g via INTRAVENOUS
  Filled 2022-04-02: qty 100

## 2022-04-02 MED ORDER — ONDANSETRON HCL 4 MG/2ML IJ SOLN: 4.0000 mg | Freq: Once | INTRAMUSCULAR | Status: DC | PRN

## 2022-04-02 MED ORDER — GLYCOPYRROLATE PF 0.2 MG/ML IJ SOSY
PREFILLED_SYRINGE | INTRAMUSCULAR | Status: DC | PRN
  Administered 2022-04-02: .2 mg via INTRAVENOUS

## 2022-04-02 MED ORDER — FENTANYL CITRATE (PF) 100 MCG/2ML IJ SOLN: INTRAMUSCULAR | Status: DC | PRN

## 2022-04-02 MED ORDER — ORAL CARE MOUTH RINSE: 15.0000 mL | Freq: Once | OROMUCOSAL | Status: AC

## 2022-04-02 MED ORDER — EPHEDRINE 5 MG/ML INJ
INTRAVENOUS | Status: AC
  Filled 2022-04-02: qty 5

## 2022-04-02 MED ORDER — ONDANSETRON HCL 4 MG/2ML IJ SOLN
INTRAMUSCULAR | Status: AC
  Filled 2022-04-02: qty 2

## 2022-04-02 MED ORDER — MIDAZOLAM HCL 2 MG/2ML IJ SOLN
INTRAMUSCULAR | Status: AC
Start: 2022-04-02 — End: ?
  Filled 2022-04-02: qty 2

## 2022-04-02 MED ORDER — GLYCOPYRROLATE PF 0.2 MG/ML IJ SOSY
PREFILLED_SYRINGE | INTRAMUSCULAR | Status: AC
  Filled 2022-04-02: qty 1

## 2022-04-02 MED ORDER — DEXAMETHASONE SODIUM PHOSPHATE 10 MG/ML IJ SOLN
INTRAMUSCULAR | Status: AC
  Filled 2022-04-02: qty 1

## 2022-04-02 MED ORDER — OXYCODONE HCL 5 MG/5ML PO SOLN: 5.0000 mg | Freq: Once | ORAL | Status: AC | PRN

## 2022-04-02 MED ORDER — FENTANYL CITRATE (PF) 100 MCG/2ML IJ SOLN
INTRAMUSCULAR | Status: AC
  Filled 2022-04-02: qty 2

## 2022-04-02 MED ORDER — ROCURONIUM BROMIDE 10 MG/ML (PF) SYRINGE
PREFILLED_SYRINGE | INTRAVENOUS | Status: AC
Start: 2022-04-02 — End: ?
  Filled 2022-04-02: qty 10

## 2022-04-02 MED ORDER — FENTANYL CITRATE (PF) 250 MCG/5ML IJ SOLN
INTRAMUSCULAR | Status: DC | PRN
  Administered 2022-04-02 (×3): 50 ug via INTRAVENOUS

## 2022-04-02 MED ORDER — OXYCODONE HCL 5 MG PO TABS
5.0000 mg | ORAL_TABLET | Freq: Once | ORAL | Status: AC | PRN
  Administered 2022-04-02: 5 mg via ORAL

## 2022-04-02 MED ORDER — ACETAMINOPHEN 325 MG PO TABS: 325.0000 mg | ORAL_TABLET | ORAL | Status: DC | PRN

## 2022-04-02 MED ORDER — BUPIVACAINE HCL 0.5 % IJ SOLN
INTRAMUSCULAR | Status: AC
  Filled 2022-04-02: qty 1

## 2022-04-02 MED ORDER — OXYCODONE HCL 5 MG PO TABS
5.0000 mg | ORAL_TABLET | ORAL | 0 refills | Status: AC | PRN
Start: 2022-04-02 — End: 2022-04-07

## 2022-04-02 MED ORDER — MEPERIDINE HCL 25 MG/ML IJ SOLN: 6.2500 mg | INTRAMUSCULAR | Status: DC | PRN

## 2022-04-02 MED ORDER — FENTANYL CITRATE (PF) 250 MCG/5ML IJ SOLN
INTRAMUSCULAR | Status: AC
  Filled 2022-04-02: qty 5

## 2022-04-02 MED ORDER — ACETAMINOPHEN 500 MG PO TABS
1000.0000 mg | ORAL_TABLET | Freq: Once | ORAL | Status: AC
  Administered 2022-04-02: 1000 mg via ORAL
  Filled 2022-04-02: qty 2

## 2022-04-02 MED ORDER — OXYCODONE HCL 5 MG PO TABS
ORAL_TABLET | ORAL | Status: AC
  Filled 2022-04-02: qty 1

## 2022-04-02 SURGICAL SUPPLY — 70 items
ALCOHOL 70% 16 OZ (MISCELLANEOUS) ×4 IMPLANT
APL PRP STRL LF DISP 70% ISPRP (MISCELLANEOUS) ×6
BAG COUNTER SPONGE SURGICOUNT (BAG) ×4 IMPLANT
BAG SPNG CNTER NS LX DISP (BAG) ×3
BANDAGE ESMARK 6X9 LF (GAUZE/BANDAGES/DRESSINGS) IMPLANT
BIT DRILL 3 CANN STRGHT (BIT) ×4
BLADE SURG 15 STRL LF DISP TIS (BLADE) ×3 IMPLANT
BLADE SURG 15 STRL SS (BLADE) ×4
BNDG CMPR 9X6 STRL LF SNTH (GAUZE/BANDAGES/DRESSINGS)
BNDG CMPR MED 10X6 ELC LF (GAUZE/BANDAGES/DRESSINGS) ×3
BNDG COHESIVE 4X5 TAN STRL (GAUZE/BANDAGES/DRESSINGS) IMPLANT
BNDG COHESIVE 6X5 TAN STRL LF (GAUZE/BANDAGES/DRESSINGS) IMPLANT
BNDG ELASTIC 4X5.8 VLCR STR LF (GAUZE/BANDAGES/DRESSINGS) ×1 IMPLANT
BNDG ELASTIC 6X10 VLCR STRL LF (GAUZE/BANDAGES/DRESSINGS) ×4 IMPLANT
BNDG ESMARK 6X9 LF (GAUZE/BANDAGES/DRESSINGS)
CANISTER SUCT 3000ML PPV (MISCELLANEOUS) ×4 IMPLANT
CHLORAPREP W/TINT 26 (MISCELLANEOUS) ×8 IMPLANT
COVER SURGICAL LIGHT HANDLE (MISCELLANEOUS) ×4 IMPLANT
CUFF TOURN SGL QUICK 34 (TOURNIQUET CUFF) ×4
CUFF TOURN SGL QUICK 42 (TOURNIQUET CUFF) IMPLANT
CUFF TRNQT CYL 34X4.125X (TOURNIQUET CUFF) ×3 IMPLANT
DRAPE OEC MINIVIEW 54X84 (DRAPES) ×4 IMPLANT
DRAPE U-SHAPE 47X51 STRL (DRAPES) ×4 IMPLANT
DRESSING MEPILEX FLEX 4X4 (GAUZE/BANDAGES/DRESSINGS) IMPLANT
DRILL COUNTERSINK CANN 3 (BIT) IMPLANT
DRSG MEPILEX FLEX 4X4 (GAUZE/BANDAGES/DRESSINGS) ×4
DRSG MEPITEL 4X7.2 (GAUZE/BANDAGES/DRESSINGS) ×4 IMPLANT
DRSG PAD ABDOMINAL 8X10 ST (GAUZE/BANDAGES/DRESSINGS) ×8 IMPLANT
DRSG XEROFORM 1X8 (GAUZE/BANDAGES/DRESSINGS) ×4 IMPLANT
ELECT REM PT RETURN 9FT ADLT (ELECTROSURGICAL) ×4
ELECTRODE REM PT RTRN 9FT ADLT (ELECTROSURGICAL) ×3 IMPLANT
GAUZE SPONGE 4X4 12PLY STRL (GAUZE/BANDAGES/DRESSINGS) ×1 IMPLANT
GAUZE SPONGE 4X4 12PLY STRL LF (GAUZE/BANDAGES/DRESSINGS) ×4 IMPLANT
GAUZE XEROFORM 1X8 LF (GAUZE/BANDAGES/DRESSINGS) ×1 IMPLANT
GLOVE BIOGEL M STRL SZ7.5 (GLOVE) ×4 IMPLANT
GLOVE BIOGEL PI IND STRL 8 (GLOVE) ×3 IMPLANT
GLOVE BIOGEL PI INDICATOR 8 (GLOVE) ×1
GLOVE SRG 8 PF TXTR STRL LF DI (GLOVE) ×3 IMPLANT
GLOVE SURG ENC TEXT LTX SZ7.5 (GLOVE) ×4 IMPLANT
GLOVE SURG UNDER POLY LF SZ8 (GLOVE) ×4
GOWN STRL REUS W/ TWL LRG LVL3 (GOWN DISPOSABLE) ×3 IMPLANT
GOWN STRL REUS W/ TWL XL LVL3 (GOWN DISPOSABLE) ×6 IMPLANT
GOWN STRL REUS W/TWL LRG LVL3 (GOWN DISPOSABLE) ×4
GOWN STRL REUS W/TWL XL LVL3 (GOWN DISPOSABLE) ×8
GUIDEWIRE DBLE END TROCAR 0.86 (WIRE) ×1 IMPLANT
KIT BASIN OR (CUSTOM PROCEDURE TRAY) ×4 IMPLANT
KIT TURNOVER KIT B (KITS) ×4 IMPLANT
NS IRRIG 1000ML POUR BTL (IV SOLUTION) ×4 IMPLANT
PACK ORTHO EXTREMITY (CUSTOM PROCEDURE TRAY) ×4 IMPLANT
PAD ARMBOARD 7.5X6 YLW CONV (MISCELLANEOUS) ×8 IMPLANT
PAD CAST 4YDX4 CTTN HI CHSV (CAST SUPPLIES) ×3 IMPLANT
PADDING CAST COTTON 4X4 STRL (CAST SUPPLIES) ×4
PADDING CAST COTTON 6X4 STRL (CAST SUPPLIES) ×1 IMPLANT
PADDING CAST SYN 6 (CAST SUPPLIES) ×1
PADDING CAST SYNTHETIC 4 (CAST SUPPLIES) ×1
PADDING CAST SYNTHETIC 4X4 STR (CAST SUPPLIES) IMPLANT
PADDING CAST SYNTHETIC 6X4 NS (CAST SUPPLIES) IMPLANT
SCREW CANN COMP FT 2.5X20 (Screw) ×1 IMPLANT
SPLINT PLASTER CAST XFAST 5X30 (CAST SUPPLIES) IMPLANT
SPLINT PLASTER XFAST SET 5X30 (CAST SUPPLIES) ×1
SPONGE T-LAP 18X18 ~~LOC~~+RFID (SPONGE) ×4 IMPLANT
SUCTION FRAZIER HANDLE 10FR (MISCELLANEOUS) ×4
SUCTION TUBE FRAZIER 10FR DISP (MISCELLANEOUS) ×3 IMPLANT
SUT ETHILON 3 0 PS 1 (SUTURE) ×4 IMPLANT
SUT MNCRL AB 3-0 PS2 27 (SUTURE) ×4 IMPLANT
SUT VIC AB 2-0 CT1 27 (SUTURE) ×8
SUT VIC AB 2-0 CT1 TAPERPNT 27 (SUTURE) ×6 IMPLANT
TOWEL GREEN STERILE (TOWEL DISPOSABLE) ×4 IMPLANT
TOWEL GREEN STERILE FF (TOWEL DISPOSABLE) ×4 IMPLANT
TUBE CONNECTING 12X1/4 (SUCTIONS) ×4 IMPLANT

## 2022-04-02 NOTE — Anesthesia Preprocedure Evaluation (Signed)
Anesthesia Evaluation  Patient identified by MRN, date of birth, ID band Patient awake    Reviewed: Allergy & Precautions, H&P , NPO status , Patient's Chart, lab work & pertinent test results, reviewed documented beta blocker date and time   Airway Mallampati: II  TM Distance: >3 FB Neck ROM: full    Dental no notable dental hx.    Pulmonary neg pulmonary ROS, Current Smoker,    Pulmonary exam normal breath sounds clear to auscultation       Cardiovascular Exercise Tolerance: Good negative cardio ROS   Rhythm:regular Rate:Normal     Neuro/Psych negative neurological ROS  negative psych ROS   GI/Hepatic negative GI ROS, Neg liver ROS,   Endo/Other  negative endocrine ROS  Renal/GU negative Renal ROS  negative genitourinary   Musculoskeletal   Abdominal   Peds  Hematology negative hematology ROS (+)   Anesthesia Other Findings   Reproductive/Obstetrics negative OB ROS                             Anesthesia Physical Anesthesia Plan  ASA: 2  Anesthesia Plan: General   Post-op Pain Management: Regional block*, Tylenol PO (pre-op)* and Gabapentin PO (pre-op)*   Induction: Intravenous  PONV Risk Score and Plan: 2 and Ondansetron and Dexamethasone  Airway Management Planned: Oral ETT and LMA  Additional Equipment: None  Intra-op Plan:   Post-operative Plan:   Informed Consent: I have reviewed the patients History and Physical, chart, labs and discussed the procedure including the risks, benefits and alternatives for the proposed anesthesia with the patient or authorized representative who has indicated his/her understanding and acceptance.     Dental Advisory Given  Plan Discussed with: CRNA and Anesthesiologist  Anesthesia Plan Comments:         Anesthesia Quick Evaluation
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
## 2022-04-02 NOTE — Anesthesia Procedure Notes (Signed)
Procedure Name: LMA Insertion Date/Time: 04/02/2022 10:17 AM  Performed by: Shary Decamp, CRNAPre-anesthesia Checklist: Patient identified, Emergency Drugs available, Suction available and Patient being monitored Patient Re-evaluated:Patient Re-evaluated prior to induction Oxygen Delivery Method: Circle system utilized Preoxygenation: Pre-oxygenation with 100% oxygen Induction Type: IV induction Ventilation: Mask ventilation without difficulty LMA: LMA inserted LMA Size: 4.0 Tube type: Oral Number of attempts: 1 Placement Confirmation: positive ETCO2 and breath sounds checked- equal and bilateral Tube secured with: Tape Dental Injury: Teeth and Oropharynx as per pre-operative assessment

## 2022-04-04 ENCOUNTER — Encounter (HOSPITAL_COMMUNITY): Payer: Self-pay | Admitting: Orthopaedic Surgery
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
## 2024-01-07 ENCOUNTER — Encounter (HOSPITAL_COMMUNITY): Payer: Self-pay

## 2024-01-07 ENCOUNTER — Other Ambulatory Visit: Payer: Self-pay

## 2024-01-07 ENCOUNTER — Emergency Department (HOSPITAL_COMMUNITY)
Admission: EM | Admit: 2024-01-07 | Discharge: 2024-01-07 | Disposition: A | Discharge status: Home / Self Care | Attending: Emergency Medicine | Admitting: Emergency Medicine

## 2024-01-07 ENCOUNTER — Emergency Department (HOSPITAL_COMMUNITY)

## 2024-01-07 DIAGNOSIS — Z91010 Allergy to peanuts: Secondary | ICD-10-CM | POA: Diagnosis not present

## 2024-01-07 DIAGNOSIS — R1030 Lower abdominal pain, unspecified: Secondary | ICD-10-CM | POA: Insufficient documentation

## 2024-01-07 DIAGNOSIS — R112 Nausea with vomiting, unspecified: Secondary | ICD-10-CM | POA: Diagnosis present

## 2024-01-07 LAB — COMPREHENSIVE METABOLIC PANEL WITH GFR
ALT: 13 U/L (ref 0–44)
AST: 19 U/L (ref 15–41)
Albumin: 4.8 g/dL (ref 3.5–5.0)
Alkaline Phosphatase: 67 U/L (ref 38–126)
Anion gap: 13 (ref 5–15)
BUN: 13 mg/dL (ref 6–20)
CO2: 25 mmol/L (ref 22–32)
Calcium: 10.1 mg/dL (ref 8.9–10.3)
Chloride: 102 mmol/L (ref 98–111)
Creatinine, Ser: 1.08 mg/dL (ref 0.61–1.24)
GFR, Estimated: >60 mL/min (ref >60)
Glucose, Bld: 109 mg/dL — ABNORMAL HIGH (ref 70–99)
Potassium: 3.9 mmol/L (ref 3.5–5.1)
Sodium: 140 mmol/L (ref 135–145)
Total Bilirubin: 1 mg/dL (ref 0.0–1.2)
Total Protein: 7.9 g/dL (ref 6.5–8.1)

## 2024-01-07 LAB — CBC WITH DIFFERENTIAL/PLATELET
Abs Immature Granulocytes: 0.03 10*3/uL (ref 0.00–0.07)
Basophils Absolute: 0 10*3/uL (ref 0.0–0.1)
Basophils Relative: 0 %
Eosinophils Absolute: 0 10*3/uL (ref 0.0–0.5)
Eosinophils Relative: 0 %
HCT: 44.9 % (ref 39.0–52.0)
Hemoglobin: 15.6 g/dL (ref 13.0–17.0)
Immature Granulocytes: 0 %
Lymphocytes Relative: 17 %
Lymphs Abs: 1.8 10*3/uL (ref 0.7–4.0)
MCH: 32.3 pg (ref 26.0–34.0)
MCHC: 34.7 g/dL (ref 30.0–36.0)
MCV: 93 fL (ref 80.0–100.0)
Monocytes Absolute: 0.6 10*3/uL (ref 0.1–1.0)
Monocytes Relative: 6 %
Neutro Abs: 8.2 10*3/uL — ABNORMAL HIGH (ref 1.7–7.7)
Neutrophils Relative %: 77 %
Platelets: 304 10*3/uL (ref 150–400)
RBC: 4.83 MIL/uL (ref 4.22–5.81)
RDW: 11.9 % (ref 11.5–15.5)
WBC: 10.7 10*3/uL — ABNORMAL HIGH (ref 4.0–10.5)
nRBC: 0 % (ref 0.0–0.2)

## 2024-01-07 LAB — LIPASE, BLOOD: Lipase: 28 U/L (ref 11–51)

## 2024-01-07 LAB — URINALYSIS, ROUTINE W REFLEX MICROSCOPIC
Bilirubin Urine: NEGATIVE
Glucose, UA: NEGATIVE mg/dL
Hgb urine dipstick: NEGATIVE
Ketones, ur: 80 mg/dL — AB
Leukocytes,Ua: NEGATIVE
Nitrite: NEGATIVE
Protein, ur: 30 mg/dL — AB
Specific Gravity, Urine: >1.046 — ABNORMAL HIGH (ref 1.005–1.030)
pH: 8 (ref 5.0–8.0)

## 2024-01-07 MED ORDER — IOHEXOL 350 MG/ML SOLN
75.0000 mL | Freq: Once | INTRAVENOUS | Status: AC | PRN
  Administered 2024-01-07: 75 mL via INTRAVENOUS

## 2024-01-07 MED ORDER — ONDANSETRON HCL 4 MG/2ML IJ SOLN
4.0000 mg | Freq: Once | INTRAMUSCULAR | Status: AC
Start: 2024-01-07 — End: 2024-01-07
  Administered 2024-01-07: 4 mg via INTRAVENOUS
  Filled 2024-01-07: qty 2

## 2024-01-07 MED ORDER — LACTATED RINGERS IV BOLUS
1000.0000 mL | Freq: Once | INTRAVENOUS | Status: AC
  Administered 2024-01-07: 1000 mL via INTRAVENOUS

## 2024-01-07 MED ORDER — MORPHINE SULFATE (PF) 4 MG/ML IV SOLN
4.0000 mg | Freq: Once | INTRAVENOUS | Status: AC
  Administered 2024-01-07: 4 mg via INTRAVENOUS
  Filled 2024-01-07: qty 1

## 2024-01-07 MED ORDER — METOCLOPRAMIDE HCL 5 MG/ML IJ SOLN
10.0000 mg | Freq: Once | INTRAMUSCULAR | Status: AC
  Administered 2024-01-07: 10 mg via INTRAVENOUS
  Filled 2024-01-07: qty 2

## 2024-01-07 MED ORDER — DROPERIDOL 2.5 MG/ML IJ SOLN
2.5000 mg | Freq: Once | INTRAMUSCULAR | Status: AC
  Administered 2024-01-07: 2.5 mg via INTRAVENOUS
  Filled 2024-01-07: qty 2

## 2024-01-07 NOTE — ED Notes (Signed)
 Pt seen walking out of ER. MD made aware

## 2024-01-07 NOTE — ED Notes (Signed)
 Please give update to mother, number under emergency contacts

## 2024-01-07 NOTE — ED Provider Notes (Signed)
 [place] EMERGENCY DEPARTMENT AT [facility] Provider Note   CSN: [id number] Arrival date & time: 01/07/24  1829     History  Chief Complaint  Patient presents with   Abdominal Pain    Tom Carpenter is a 23 y.o. male.  HPI 23 year old male presents with lower abdominal pain and vomiting. Symptoms started this morning. No fevers but he was sweating a lot. No back pain or diarrhea.   Home Medications Prior to Admission medications   Medication Sig Start Date End Date Taking? Authorizing Provider  ibuprofen (ADVIL) 600 MG tablet Take 1 tablet (600 mg total) by mouth every 6 (six) hours as needed. 03/26/22   Long, Arlyss Repress, MD  potassium chloride SA (KLOR-CON M) 20 MEQ tablet Take 2 tablets (40 mEq total) by mouth daily for 4 days. Patient not taking: Reported on 04/01/2022 03/26/22 03/30/22  Maia Plan, MD  senna-docusate (SENOKOT-S) 8.6-50 MG tablet Take 1 tablet by mouth at bedtime as needed for mild constipation. Patient not taking: Reported on 04/01/2022 03/26/22   Maia Plan, MD      Allergies    Peanut butter flavoring agent (non-screening)    Review of Systems   Review of Systems  Constitutional:  Positive for diaphoresis. Negative for fever.  Gastrointestinal:  Positive for abdominal pain, nausea and vomiting. Negative for diarrhea.    Physical Exam Updated Vital Signs BP (!) 147/92   Pulse 76   Temp 98 F (36.7 C)   Resp 18   Ht 6\' 1"  (1.854 m)   Wt 82 kg   SpO2 99%   BMI 23.85 kg/m  Physical Exam Vitals and nursing note reviewed.  Constitutional:      Appearance: He is well-developed.  HENT:     Head: Normocephalic and atraumatic.  Cardiovascular:     Rate and Rhythm: Normal rate and regular rhythm.     Heart sounds: Normal heart sounds.  Pulmonary:     Effort: Pulmonary effort is normal.     Breath sounds: Normal breath sounds.  Abdominal:     Palpations: Abdomen is soft.     Tenderness: There is generalized abdominal tenderness  (worst in lower abdomen).  Skin:    General: Skin is warm and dry.  Neurological:     Mental Status: He is alert.     ED Results / Procedures / Treatments   Labs (all labs ordered are listed, but only abnormal results are displayed) Labs Reviewed  COMPREHENSIVE METABOLIC PANEL WITH GFR - Abnormal; Notable for the following components:      Result Value   Glucose, Bld 109 (*)    All other components within normal limits  CBC WITH DIFFERENTIAL/PLATELET - Abnormal; Notable for the following components:   WBC 10.7 (*)    Neutro Abs 8.2 (*)    All other components within normal limits  LIPASE, BLOOD  URINALYSIS, ROUTINE W REFLEX MICROSCOPIC   ED ECG REPORT   Date: 01/07/2024  Rate: 60  Rhythm: normal sinus rhythm  QRS Axis: normal  Intervals: normal  ST/T Wave abnormalities: normal  Conduction Disutrbances:none - qtc 446  Narrative Interpretation:   Old EKG Reviewed: none available  I have personally reviewed the EKG tracing and agree with the computerized printout as noted.   Radiology CT ABDOMEN PELVIS W CONTRAST Result Date: 01/07/2024 CLINICAL DATA:  Left lower quadrant pain EXAM: CT ABDOMEN AND PELVIS WITH CONTRAST TECHNIQUE: Multidetector CT imaging of the abdomen and pelvis was  performed using the standard protocol following bolus administration of intravenous contrast. RADIATION DOSE REDUCTION: This exam was performed according to the departmental dose-optimization program which includes automated exposure control, adjustment of the mA and/or kV according to patient size and/or use of iterative reconstruction technique. CONTRAST:  75mL OMNIPAQUE IOHEXOL 350 MG/ML SOLN COMPARISON:  None Available. FINDINGS: Lower chest: No acute abnormality Hepatobiliary: No focal hepatic abnormality. Gallbladder unremarkable. Pancreas: No focal abnormality or ductal dilatation. Spleen: No focal abnormality.  Normal size.  New Adrenals/Urinary Tract: No adrenal abnormality. No focal renal  abnormality. No stones or hydronephrosis. Urinary bladder is unremarkable. Stomach/Bowel: Normal appendix. Stomach, large and small bowel grossly unremarkable. Vascular/Lymphatic: No evidence of aneurysm or adenopathy. Reproductive: No visible focal abnormality. Other: No free fluid or free air. Musculoskeletal: No acute bony abnormality. IMPRESSION: No acute findings in the abdomen or pelvis. Electronically Signed   By: Charlett Nose M.D.   On: 01/07/2024 22:00    Procedures Procedures    Medications Ordered in ED Medications  droperidol (INAPSINE) 2.5 MG/ML injection 2.5 mg (has no administration in time range)  lactated ringers bolus 1,000 mL (0 mLs Intravenous Stopped 01/07/24 2021)  morphine (PF) 4 MG/ML injection 4 mg (4 mg Intravenous Given 01/07/24 1918)  metoCLOPramide (REGLAN) injection 10 mg (10 mg Intravenous Given 01/07/24 1917)  ondansetron (ZOFRAN) injection 4 mg (4 mg Intravenous Given 01/07/24 2109)  iohexol (OMNIPAQUE) 350 MG/ML injection 75 mL (75 mLs Intravenous Contrast Given 01/07/24 2122)    ED Course/ Medical Decision Making/ A&P                                 Medical Decision Making Amount and/or Complexity of Data Reviewed Labs: ordered.    Details: Mild leukocytosis Radiology: ordered.    Details: No appendicitis ECG/medicine tests: ordered and independent interpretation performed.    Details: QTC 446  Risk Prescription drug management.   Patient was given narcotics as well as antiemetics but still having vomiting.  Was given some IV fluids.  Vital signs are reassuring.  Labs show some dehydration with ketones but no other significant findings.  CT is unremarkable.  Given he still having some symptoms we will give droperidol and he will need reassessment.  Care transferred to Dr. Blinda Leatherwood.        Final Clinical Impression(s) / ED Diagnoses Final diagnoses:  None    Rx / DC Orders ED Discharge Orders     None         Pricilla Loveless, MD 01/07/24  2321

## 2024-01-07 NOTE — ED Triage Notes (Signed)
 Abdominal pain, nausea and vomiting started this am, last ate popeyes yesterday. Last marijuana use was approx 3 weeks ago

## 2024-02-07 ENCOUNTER — Observation Stay (HOSPITAL_COMMUNITY)

## 2024-02-07 ENCOUNTER — Other Ambulatory Visit: Payer: Self-pay

## 2024-02-07 ENCOUNTER — Emergency Department (HOSPITAL_COMMUNITY)

## 2024-02-07 ENCOUNTER — Observation Stay (HOSPITAL_COMMUNITY)
Admission: EM | Admit: 2024-02-07 | Discharge: 2024-02-07 | Disposition: A | Discharge status: Home / Self Care | Attending: Emergency Medicine | Admitting: Emergency Medicine

## 2024-02-07 DIAGNOSIS — S92902B Unspecified fracture of left foot, initial encounter for open fracture: Secondary | ICD-10-CM

## 2024-02-07 DIAGNOSIS — Z23 Encounter for immunization: Secondary | ICD-10-CM | POA: Diagnosis not present

## 2024-02-07 DIAGNOSIS — Y249XXA Unspecified firearm discharge, undetermined intent, initial encounter: Secondary | ICD-10-CM

## 2024-02-07 DIAGNOSIS — X58XXXA Exposure to other specified factors, initial encounter: Secondary | ICD-10-CM | POA: Insufficient documentation

## 2024-02-07 DIAGNOSIS — S92812B Other fracture of left foot, initial encounter for open fracture: Principal | ICD-10-CM | POA: Insufficient documentation

## 2024-02-07 DIAGNOSIS — W3400XA Accidental discharge from unspecified firearms or gun, initial encounter: Principal | ICD-10-CM

## 2024-02-07 DIAGNOSIS — S91302A Unspecified open wound, left foot, initial encounter: Secondary | ICD-10-CM | POA: Diagnosis present

## 2024-02-07 LAB — CBC WITH DIFFERENTIAL/PLATELET
Abs Immature Granulocytes: 0.06 10*3/uL (ref 0.00–0.07)
Basophils Absolute: 0 10*3/uL (ref 0.0–0.1)
Basophils Relative: 0 %
Eosinophils Absolute: 0.2 10*3/uL (ref 0.0–0.5)
Eosinophils Relative: 2 %
HCT: 42.7 % (ref 39.0–52.0)
Hemoglobin: 14.4 g/dL (ref 13.0–17.0)
Immature Granulocytes: 1 %
Lymphocytes Relative: 30 %
Lymphs Abs: 2.9 10*3/uL (ref 0.7–4.0)
MCH: 32.7 pg (ref 26.0–34.0)
MCHC: 33.7 g/dL (ref 30.0–36.0)
MCV: 96.8 fL (ref 80.0–100.0)
Monocytes Absolute: 0.6 10*3/uL (ref 0.1–1.0)
Monocytes Relative: 7 %
Neutro Abs: 6 10*3/uL (ref 1.7–7.7)
Neutrophils Relative %: 60 %
Platelets: 340 10*3/uL (ref 150–400)
RBC: 4.41 MIL/uL (ref 4.22–5.81)
RDW: 13.1 % (ref 11.5–15.5)
WBC: 9.9 10*3/uL (ref 4.0–10.5)
nRBC: 0 % (ref 0.0–0.2)

## 2024-02-07 LAB — BASIC METABOLIC PANEL WITH GFR
Anion gap: 13 (ref 5–15)
BUN: 8 mg/dL (ref 6–20)
CO2: 22 mmol/L (ref 22–32)
Calcium: 9.2 mg/dL (ref 8.9–10.3)
Chloride: 104 mmol/L (ref 98–111)
Creatinine, Ser: 0.95 mg/dL (ref 0.61–1.24)
GFR, Estimated: >60 mL/min (ref >60)
Glucose, Bld: 110 mg/dL — ABNORMAL HIGH (ref 70–99)
Potassium: 3.4 mmol/L — ABNORMAL LOW (ref 3.5–5.1)
Sodium: 139 mmol/L (ref 135–145)

## 2024-02-07 MED ORDER — TETANUS-DIPHTH-ACELL PERTUSSIS 5-2.5-18.5 LF-MCG/0.5 IM SUSY
0.5000 mL | PREFILLED_SYRINGE | Freq: Once | INTRAMUSCULAR | Status: AC
  Administered 2024-02-07: 0.5 mL via INTRAMUSCULAR
  Filled 2024-02-07: qty 0.5

## 2024-02-07 MED ORDER — OXYCODONE-ACETAMINOPHEN 5-325 MG PO TABS
1.0000 | ORAL_TABLET | Freq: Once | ORAL | Status: AC
  Administered 2024-02-07: 1 via ORAL
  Filled 2024-02-07: qty 1

## 2024-02-07 MED ORDER — CEFAZOLIN SODIUM-DEXTROSE 1-4 GM/50ML-% IV SOLN
1.0000 g | Freq: Once | INTRAVENOUS | Status: AC
  Administered 2024-02-07: 1 g via INTRAVENOUS
  Filled 2024-02-07: qty 50

## 2024-02-07 MED ORDER — OXYCODONE-ACETAMINOPHEN 5-325 MG PO TABS
2.0000 | ORAL_TABLET | Freq: Once | ORAL | Status: AC
  Administered 2024-02-07: 2 via ORAL
  Filled 2024-02-07: qty 2

## 2024-02-07 MED ORDER — HYDROMORPHONE HCL 1 MG/ML IJ SOLN
1.0000 mg | Freq: Once | INTRAMUSCULAR | Status: AC
  Administered 2024-02-07: 1 mg via INTRAVENOUS
  Filled 2024-02-07: qty 1

## 2024-02-07 MED ORDER — OXYCODONE-ACETAMINOPHEN 5-325 MG PO TABS: 1.0000 | ORAL_TABLET | Freq: Three times a day (TID) | ORAL | 0 refills | Status: DC | PRN

## 2024-02-07 MED ORDER — CEPHALEXIN 500 MG PO CAPS
500.0000 mg | ORAL_CAPSULE | Freq: Four times a day (QID) | ORAL | 0 refills | Status: DC
Start: 2024-02-07 — End: 2024-08-06
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
## 2024-02-07 NOTE — Progress Notes (Signed)
 Orthopedic Tech Progress Note Patient Details:  Tom Carpenter [birth year] [id number]  Ortho Devices Type of Ortho Device: CAM walker, Crutches Ortho Device/Splint Location: Left foot Ortho Device/Splint Interventions: Application   Post Interventions Patient Tolerated: Well  Tiea Manninen E Blayre Papania 02/07/2024, 7:50 AM

## 2024-02-07 NOTE — ED Notes (Signed)
 Patient transported to CT
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
## 2024-02-07 NOTE — Discharge Instructions (Addendum)
 Non weight bearing to left foot until seen by Dr. Hermina Loosen.

## 2024-02-07 NOTE — ED Notes (Signed)
 Patient back from CT.

## 2024-02-09 ENCOUNTER — Ambulatory Visit: Admitting: Orthopedic Surgery

## 2024-02-09 ENCOUNTER — Encounter: Payer: Self-pay | Admitting: Orthopedic Surgery

## 2024-02-09 DIAGNOSIS — S91332A Puncture wound without foreign body, left foot, initial encounter: Secondary | ICD-10-CM

## 2024-02-09 DIAGNOSIS — M795 Residual foreign body in soft tissue: Secondary | ICD-10-CM

## 2024-02-09 MED ORDER — CEPHALEXIN 500 MG PO CAPS: 500.0000 mg | ORAL_CAPSULE | Freq: Three times a day (TID) | ORAL | 0 refills | Status: DC

## 2024-02-09 MED ORDER — OXYCODONE-ACETAMINOPHEN 5-325 MG PO TABS: 1.0000 | ORAL_TABLET | Freq: Three times a day (TID) | ORAL | 0 refills | Status: DC | PRN
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
## 2024-02-16 ENCOUNTER — Encounter: Payer: Self-pay | Admitting: Orthopedic Surgery

## 2024-02-16 ENCOUNTER — Ambulatory Visit: Admitting: Orthopedic Surgery

## 2024-02-16 DIAGNOSIS — S91332A Puncture wound without foreign body, left foot, initial encounter: Secondary | ICD-10-CM

## 2024-02-16 DIAGNOSIS — M795 Residual foreign body in soft tissue: Secondary | ICD-10-CM
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
## 2024-02-23 ENCOUNTER — Telehealth: Payer: Self-pay | Admitting: Orthopaedic Surgery

## 2024-02-23 ENCOUNTER — Other Ambulatory Visit: Payer: Self-pay | Admitting: Orthopedic Surgery

## 2024-02-23 MED ORDER — OXYCODONE-ACETAMINOPHEN 5-325 MG PO TABS: 1.0000 | ORAL_TABLET | Freq: Three times a day (TID) | ORAL | 0 refills | Status: DC | PRN

## 2024-02-23 NOTE — Telephone Encounter (Signed)
 Patient called and he needs a refill and also that his foot is sending a shock pain up is leg. [phone] or [phone]

## 2024-03-03 ENCOUNTER — Other Ambulatory Visit: Payer: Self-pay | Admitting: Orthopedic Surgery

## 2024-03-03 ENCOUNTER — Telehealth: Payer: Self-pay | Admitting: Orthopedic Surgery

## 2024-03-03 MED ORDER — OXYCODONE-ACETAMINOPHEN 5-325 MG PO TABS: 1.0000 | ORAL_TABLET | Freq: Three times a day (TID) | ORAL | 0 refills | Status: DC | PRN
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
## 2024-03-03 NOTE — Telephone Encounter (Signed)
Patient called. He would like a refill on his pain medication.  °

## 2024-03-04 ENCOUNTER — Telehealth: Payer: Self-pay | Admitting: Orthopedic Surgery

## 2024-03-04 NOTE — Telephone Encounter (Signed)
 PA was started yesterday on [email or web address]. Pending authorization with his insurance.

## 2024-03-04 NOTE — Telephone Encounter (Signed)
 Oxycodone  approved by insurance. LM on VM for pt to contact his pharmacy to let them know and they can get it filled for him. Also mentioned that per Dr. Julio Ohm this is his last pain refill so if he is in this much pain still he will need to come into the office for a follow up to make sure everything is healing fine and no signs of infection. Advised he needs to call our office to schedule a follow up appt.

## 2024-03-04 NOTE — Telephone Encounter (Signed)
 Pt called requesting pre auth to pharmacy for his oxycodone . Pt said he can't take this pain no more. He has been laying in bed and it's starting to mess with his mental with all the pain he is in and no medication. He states he has a hard time getting his meds every time and asking Dr Julio Ohm if he can please do something to help him. Pt is asking if we can call pharmacy with pre auth and call him when meds can be picked up. Pt phone number is [phone].

## 2024-03-05 ENCOUNTER — Other Ambulatory Visit: Payer: Self-pay | Admitting: Family

## 2024-03-11 ENCOUNTER — Telehealth: Payer: Self-pay | Admitting: Physician Assistant

## 2024-03-11 NOTE — Telephone Encounter (Signed)
 Patient called and needs a refill on the pain medication and infection medication. CB#[phone]

## 2024-03-12 ENCOUNTER — Other Ambulatory Visit: Payer: Self-pay | Admitting: Family

## 2024-03-15 ENCOUNTER — Ambulatory Visit: Admitting: Orthopedic Surgery

## 2024-03-16 ENCOUNTER — Ambulatory Visit: Admitting: Physician Assistant

## 2024-03-19 ENCOUNTER — Encounter: Payer: Self-pay | Admitting: Physician Assistant

## 2024-03-19 ENCOUNTER — Ambulatory Visit: Admitting: Physician Assistant

## 2024-03-19 ENCOUNTER — Other Ambulatory Visit (INDEPENDENT_AMBULATORY_CARE_PROVIDER_SITE_OTHER): Payer: Self-pay

## 2024-03-19 DIAGNOSIS — M79672 Pain in left foot: Secondary | ICD-10-CM

## 2024-03-19 MED ORDER — OXYCODONE-ACETAMINOPHEN 5-325 MG PO TABS: 1.0000 | ORAL_TABLET | Freq: Three times a day (TID) | ORAL | 0 refills | Status: AC | PRN
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
## 2024-03-29 ENCOUNTER — Ambulatory Visit: Attending: Physician Assistant | Admitting: Physical Therapy

## 2024-04-22 ENCOUNTER — Ambulatory Visit: Admitting: Orthopedic Surgery

## 2024-08-06 ENCOUNTER — Other Ambulatory Visit: Payer: Self-pay

## 2024-08-06 ENCOUNTER — Emergency Department (HOSPITAL_COMMUNITY)

## 2024-08-06 ENCOUNTER — Emergency Department (HOSPITAL_COMMUNITY)
Admission: EM | Admit: 2024-08-06 | Discharge: 2024-08-06 | Disposition: A | Discharge status: Home / Self Care | Attending: Emergency Medicine | Admitting: Emergency Medicine

## 2024-08-06 DIAGNOSIS — R1110 Vomiting, unspecified: Secondary | ICD-10-CM | POA: Diagnosis present

## 2024-08-06 DIAGNOSIS — K529 Noninfective gastroenteritis and colitis, unspecified: Secondary | ICD-10-CM | POA: Insufficient documentation

## 2024-08-06 DIAGNOSIS — Z91010 Allergy to peanuts: Secondary | ICD-10-CM | POA: Diagnosis not present

## 2024-08-06 DIAGNOSIS — E860 Dehydration: Secondary | ICD-10-CM | POA: Diagnosis not present

## 2024-08-06 LAB — COMPREHENSIVE METABOLIC PANEL WITH GFR
ALT: <5 U/L (ref 0–44)
AST: 23 U/L (ref 15–41)
Albumin: 4.8 g/dL (ref 3.5–5.0)
Alkaline Phosphatase: 84 U/L (ref 38–126)
Anion gap: 12 (ref 5–15)
BUN: 9 mg/dL (ref 6–20)
CO2: 24 mmol/L (ref 22–32)
Calcium: 10 mg/dL (ref 8.9–10.3)
Chloride: 104 mmol/L (ref 98–111)
Creatinine, Ser: 0.9 mg/dL (ref 0.61–1.24)
GFR, Estimated: >60 mL/min (ref >60)
Glucose, Bld: 121 mg/dL — ABNORMAL HIGH (ref 70–99)
Potassium: 3.9 mmol/L (ref 3.5–5.1)
Sodium: 140 mmol/L (ref 135–145)
Total Bilirubin: 0.3 mg/dL (ref 0.0–1.2)
Total Protein: 8 g/dL (ref 6.5–8.1)

## 2024-08-06 LAB — CBC WITH DIFFERENTIAL/PLATELET
Abs Immature Granulocytes: 0.03 K/uL (ref 0.00–0.07)
Basophils Absolute: 0 K/uL (ref 0.0–0.1)
Basophils Relative: 0 %
Eosinophils Absolute: 0 K/uL (ref 0.0–0.5)
Eosinophils Relative: 0 %
HCT: 43.4 % (ref 39.0–52.0)
Hemoglobin: 14.5 g/dL (ref 13.0–17.0)
Immature Granulocytes: 0 %
Lymphocytes Relative: 10 %
Lymphs Abs: 1.2 K/uL (ref 0.7–4.0)
MCH: 31 pg (ref 26.0–34.0)
MCHC: 33.4 g/dL (ref 30.0–36.0)
MCV: 92.7 fL (ref 80.0–100.0)
Monocytes Absolute: 0.5 K/uL (ref 0.1–1.0)
Monocytes Relative: 4 %
Neutro Abs: 9.5 K/uL — ABNORMAL HIGH (ref 1.7–7.7)
Neutrophils Relative %: 86 %
Platelets: 294 K/uL (ref 150–400)
RBC: 4.68 MIL/uL (ref 4.22–5.81)
RDW: 12.9 % (ref 11.5–15.5)
WBC: 11.2 K/uL — ABNORMAL HIGH (ref 4.0–10.5)
nRBC: 0 % (ref 0.0–0.2)

## 2024-08-06 LAB — LIPASE, BLOOD: Lipase: 23 U/L (ref 11–51)

## 2024-08-06 MED ORDER — METRONIDAZOLE 500 MG PO TABS: 500.0000 mg | ORAL_TABLET | Freq: Two times a day (BID) | ORAL | 0 refills | Status: AC

## 2024-08-06 MED ORDER — ONDANSETRON 4 MG PO TBDP: ORAL_TABLET | ORAL | 0 refills | Status: AC

## 2024-08-06 MED ORDER — KETOROLAC TROMETHAMINE 30 MG/ML IJ SOLN
30.0000 mg | Freq: Once | INTRAMUSCULAR | Status: AC
  Administered 2024-08-06: 30 mg via INTRAVENOUS
  Filled 2024-08-06: qty 1

## 2024-08-06 MED ORDER — IOHEXOL 300 MG/ML  SOLN
100.0000 mL | Freq: Once | INTRAMUSCULAR | Status: AC | PRN
  Administered 2024-08-06: 100 mL via INTRAVENOUS

## 2024-08-06 MED ORDER — ONDANSETRON 4 MG PO TBDP: 4.0000 mg | ORAL_TABLET | Freq: Once | ORAL | Status: DC

## 2024-08-06 MED ORDER — SODIUM CHLORIDE 0.9 % IV BOLUS
2000.0000 mL | Freq: Once | INTRAVENOUS | Status: AC
  Administered 2024-08-06: 2000 mL via INTRAVENOUS

## 2024-08-06 MED ORDER — PROCHLORPERAZINE EDISYLATE 10 MG/2ML IJ SOLN
10.0000 mg | Freq: Once | INTRAMUSCULAR | Status: AC
  Administered 2024-08-06: 10 mg via INTRAVENOUS
  Filled 2024-08-06: qty 2

## 2024-08-06 MED ORDER — ONDANSETRON HCL 4 MG/2ML IJ SOLN
4.0000 mg | Freq: Once | INTRAMUSCULAR | Status: AC
  Administered 2024-08-06: 4 mg via INTRAVENOUS
  Filled 2024-08-06: qty 2

## 2024-08-06 MED ORDER — CIPROFLOXACIN HCL 500 MG PO TABS: 500.0000 mg | ORAL_TABLET | Freq: Two times a day (BID) | ORAL | 0 refills | Status: AC

## 2024-08-06 MED ORDER — PANTOPRAZOLE SODIUM 40 MG IV SOLR
40.0000 mg | Freq: Once | INTRAVENOUS | Status: AC
  Administered 2024-08-06: 40 mg via INTRAVENOUS
  Filled 2024-08-06: qty 10
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
## 2024-08-06 NOTE — ED Provider Notes (Signed)
 [place] EMERGENCY DEPARTMENT AT [facility] Provider Note   CSN: [id number] Arrival date & time: 08/06/24  1434     Patient presents with: Nausea, Emesis, and Diarrhea   Tom Carpenter is a 23 y.o. male.   Patient complains of abdominal cramping and vomiting with some diarrhea.  No fever or chills  The history is provided by the patient and medical records. No language interpreter was used.  Emesis Severity:  Mild Timing:  Constant Quality:  Bilious material Able to tolerate:  Liquids Progression:  Unchanged Chronicity:  New Recent urination:  Normal Relieved by:  Nothing Worsened by:  Nothing Ineffective treatments:  None tried Associated symptoms: diarrhea   Associated symptoms: no abdominal pain, no cough and no headaches        Prior to Admission medications   Medication Sig Start Date End Date Taking? Authorizing Provider  ciprofloxacin (CIPRO) 500 MG tablet Take 1 tablet (500 mg total) by mouth 2 (two) times daily. One po bid x 7 days 08/06/24  Yes Makaela Cando, MD  metroNIDAZOLE (FLAGYL) 500 MG tablet Take 1 tablet (500 mg total) by mouth 2 (two) times daily. 08/06/24  Yes Saleh Ulbrich, MD  ondansetron  (ZOFRAN -ODT) 4 MG disintegrating tablet 4mg  ODT q4 hours prn nausea/vomit 08/06/24  Yes Jaree Dwight, MD  oxyCODONE -acetaminophen  (PERCOCET/ROXICET) 5-325 MG tablet Take 1 tablet by mouth every 8 (eight) hours as needed. 03/19/24   Persons, Ronal Dragon, [place]    Allergies: Peanut butter flavoring agent (non-screening)    Review of Systems  Constitutional:  Negative for appetite change and fatigue.  HENT:  Negative for congestion, ear discharge and sinus pressure.   Eyes:  Negative for discharge.  Respiratory:  Negative for cough.   Cardiovascular:  Negative for chest pain.  Gastrointestinal:  Positive for diarrhea and vomiting. Negative for abdominal pain.  Genitourinary:  Negative for frequency and hematuria.  Musculoskeletal:  Negative for  back pain.  Skin:  Negative for rash.  Neurological:  Negative for seizures and headaches.  Psychiatric/Behavioral:  Negative for hallucinations.     Updated Vital Signs BP 117/64   Pulse (!) 55   Temp 98.2 F (36.8 C) (Oral)   Resp 16   Ht 6' (1.829 m)   Wt 68 kg   SpO2 100%   BMI 20.34 kg/m   Physical Exam Vitals and nursing note reviewed.  Constitutional:      Appearance: He is well-developed.  HENT:     Head: Normocephalic.     Nose: Nose normal.  Eyes:     General: No scleral icterus.    Conjunctiva/sclera: Conjunctivae normal.  Neck:     Thyroid: No thyromegaly.  Cardiovascular:     Rate and Rhythm: Normal rate and regular rhythm.     Heart sounds: No murmur heard.    No friction rub. No gallop.  Pulmonary:     Breath sounds: No stridor. No wheezing or rales.  Chest:     Chest wall: No tenderness.  Abdominal:     General: There is no distension.     Tenderness: There is no abdominal tenderness. There is no rebound.  Musculoskeletal:        General: Normal range of motion.     Cervical back: Neck supple.  Lymphadenopathy:     Cervical: No cervical adenopathy.  Skin:    Findings: No erythema or rash.  Neurological:     Mental Status: He is alert and oriented to person, place, and time.  Motor: No abnormal muscle tone.     Coordination: Coordination normal.  Psychiatric:        Behavior: Behavior normal.     (all labs ordered are listed, but only abnormal results are displayed) Labs Reviewed  CBC WITH DIFFERENTIAL/PLATELET - Abnormal; Notable for the following components:      Result Value   WBC 11.2 (*)    Neutro Abs 9.5 (*)    All other components within normal limits  COMPREHENSIVE METABOLIC PANEL WITH GFR - Abnormal; Notable for the following components:   Glucose, Bld 121 (*)    All other components within normal limits  LIPASE, BLOOD    EKG: None  Radiology: CT ABDOMEN PELVIS W CONTRAST Result Date: 08/06/2024 CLINICAL DATA:   Diffuse abdominal pain with nausea, vomiting and diarrhea. EXAM: CT ABDOMEN AND PELVIS WITH CONTRAST TECHNIQUE: Multidetector CT imaging of the abdomen and pelvis was performed using the standard protocol following bolus administration of intravenous contrast. RADIATION DOSE REDUCTION: This exam was performed according to the departmental dose-optimization program which includes automated exposure control, adjustment of the mA and/or kV according to patient size and/or use of iterative reconstruction technique. CONTRAST:  OMNIPAQUE  IOHEXOL  300 MG/ML  SOLN COMPARISON:  January 07, 2024 FINDINGS: Lower chest: A 4 mm pulmonary nodule is seen within the anterior aspect of the right middle lobe (axial CT image 2, CT series 5) Hepatobiliary: No focal liver abnormality is seen. No gallstones, gallbladder wall thickening, or biliary dilatation. Pancreas: Unremarkable. No pancreatic ductal dilatation or surrounding inflammatory changes. Spleen: Normal in size without focal abnormality. Adrenals/Urinary Tract: Adrenal glands are unremarkable. Kidneys are normal, without renal calculi, focal lesion, or hydronephrosis. The urinary bladder is poorly distended and subsequently limited in evaluation. Stomach/Bowel: Stomach is within normal limits. Appendix appears normal. No evidence of bowel dilatation. The distal ascending colon and proximal transverse colon are mildly thickened and poorly distended. Vascular/Lymphatic: No significant vascular findings are present. No enlarged abdominal or pelvic lymph nodes. Reproductive: Prostate is unremarkable. Other: No abdominal wall hernia or abnormality. No abdominopelvic ascites. Musculoskeletal: No acute or significant osseous findings. IMPRESSION: 1. Mildly thickened and poorly distended distal ascending colon and proximal transverse colon, which may represent mild colitis. 2. 4 mm right middle lobe pulmonary nodule. Correlation with nonemergent dedicated chest CT is recommended.  Electronically Signed   By: Suzen Dials M.D.   On: 08/06/2024 18:35     Procedures   Medications Ordered in the ED  sodium chloride  0.9 % bolus 2,000 mL (0 mLs Intravenous Stopped 08/06/24 1951)  ondansetron  (ZOFRAN ) injection 4 mg (4 mg Intravenous Given 08/06/24 1607)  ketorolac (TORADOL) 30 MG/ML injection 30 mg (30 mg Intravenous Given 08/06/24 1607)  pantoprazole (PROTONIX) injection 40 mg (40 mg Intravenous Given 08/06/24 1608)  iohexol  (OMNIPAQUE ) 300 MG/ML solution 100 mL (100 mLs Intravenous Contrast Given 08/06/24 1818)  prochlorperazine (COMPAZINE) injection 10 mg (10 mg Intravenous Given 08/06/24 1952)                                    Medical Decision Making Amount and/or Complexity of Data Reviewed Radiology: ordered.  Risk Prescription drug management.  Patient with mild colitis and vomiting.  She is sent home on Zofran  and Cipro Flagyl and will follow-up if not improving     Final diagnoses:  Colitis  Dehydration    ED Discharge Orders  Ordered    ondansetron  (ZOFRAN -ODT) 4 MG disintegrating tablet        08/06/24 2049    ciprofloxacin (CIPRO) 500 MG tablet  2 times daily        08/06/24 2049    metroNIDAZOLE (FLAGYL) 500 MG tablet  2 times daily        08/06/24 2049               Suzette Pac, MD 08/08/24 1012

## 2024-08-06 NOTE — Discharge Instructions (Signed)
Drink plenty of fluids.  Follow up next week if not improving. °

## 2024-08-06 NOTE — ED Provider Triage Note (Signed)
 Emergency Medicine Provider Triage Evaluation Note  Tom Carpenter , a 23 y.o. male  was evaluated in triage.  Pt complains of abd pain. Diffused abdominal pain with n/v/d since this AM.  Felt it may be due to the fish sandwich he ate last night.  Recently used marijuana.  No fever, cp, sob, dysuria  Review of Systems  Positive: As above Negative: As above  Physical Exam  BP 133/82 (BP Location: Right Arm)   Pulse (!) 47   Temp 98.4 F (36.9 C) (Oral)   Resp 16   Ht 6' (1.829 m)   Wt 68 kg   SpO2 100%   BMI 20.34 kg/m  Gen:   Awake, no distress   Resp:  Normal effort  MSK:   Moves extremities without difficulty  Other:    Medical Decision Making  Medically screening exam initiated at 3:10 PM.  Appropriate orders placed.  Tom Carpenter was informed that the remainder of the evaluation will be completed by another provider, this initial triage assessment does not replace that evaluation, and the importance of remaining in the ED until their evaluation is complete.     Nivia Colon, PA-C 08/06/24 [phone]

## 2024-08-09 ENCOUNTER — Encounter: Payer: Self-pay | Admitting: Radiology
